# Patient Record
Sex: Female | Born: 1950 | Race: White | Hispanic: No | Marital: Married | Smoking: Never smoker
Health system: Southern US, Community
[De-identification: ages and names within clinical notes are randomized; demographics above are authoritative.]

## PROBLEM LIST (undated history)

## (undated) ENCOUNTER — Emergency Department (HOSPITAL_COMMUNITY)

## (undated) DIAGNOSIS — I10 Essential (primary) hypertension: Secondary | ICD-10-CM

## (undated) DIAGNOSIS — E119 Type 2 diabetes mellitus without complications: Secondary | ICD-10-CM

## (undated) HISTORY — PX: OTHER SURGICAL HISTORY: SHX169

## (undated) HISTORY — PX: FEMUR SURGERY: SHX943

## (undated) HISTORY — PX: CHOLECYSTECTOMY: SHX55

## (undated) HISTORY — PX: PARTIAL HYSTERECTOMY: SHX80

---

## 2001-11-30 ENCOUNTER — Emergency Department (HOSPITAL_COMMUNITY): Admission: EM | Admit: 2001-11-30 | Discharge: 2001-11-30 | Payer: Self-pay | Admitting: Physical Therapy

## 2001-12-03 ENCOUNTER — Ambulatory Visit (HOSPITAL_BASED_OUTPATIENT_CLINIC_OR_DEPARTMENT_OTHER): Admission: RE | Admit: 2001-12-03 | Discharge: 2001-12-03 | Payer: Self-pay | Admitting: Orthopedic Surgery

## 2006-04-25 ENCOUNTER — Emergency Department (HOSPITAL_COMMUNITY): Admission: EM | Admit: 2006-04-25 | Discharge: 2006-04-25 | Payer: Self-pay | Admitting: Emergency Medicine

## 2011-04-14 DIAGNOSIS — F411 Generalized anxiety disorder: Secondary | ICD-10-CM | POA: Diagnosis not present

## 2011-04-14 DIAGNOSIS — E782 Mixed hyperlipidemia: Secondary | ICD-10-CM | POA: Diagnosis not present

## 2011-04-14 DIAGNOSIS — E669 Obesity, unspecified: Secondary | ICD-10-CM | POA: Diagnosis not present

## 2011-04-14 DIAGNOSIS — I10 Essential (primary) hypertension: Secondary | ICD-10-CM | POA: Diagnosis not present

## 2011-04-14 DIAGNOSIS — B3784 Candidal otitis externa: Secondary | ICD-10-CM | POA: Diagnosis not present

## 2011-04-19 DIAGNOSIS — M1710 Unilateral primary osteoarthritis, unspecified knee: Secondary | ICD-10-CM | POA: Diagnosis not present

## 2011-04-19 DIAGNOSIS — M25869 Other specified joint disorders, unspecified knee: Secondary | ICD-10-CM | POA: Diagnosis not present

## 2011-04-19 DIAGNOSIS — IMO0002 Reserved for concepts with insufficient information to code with codable children: Secondary | ICD-10-CM | POA: Diagnosis not present

## 2011-04-19 DIAGNOSIS — M25569 Pain in unspecified knee: Secondary | ICD-10-CM | POA: Diagnosis not present

## 2011-04-19 DIAGNOSIS — M899 Disorder of bone, unspecified: Secondary | ICD-10-CM | POA: Diagnosis not present

## 2011-07-27 DIAGNOSIS — Z1231 Encounter for screening mammogram for malignant neoplasm of breast: Secondary | ICD-10-CM | POA: Diagnosis not present

## 2011-08-01 DIAGNOSIS — S86819A Strain of other muscle(s) and tendon(s) at lower leg level, unspecified leg, initial encounter: Secondary | ICD-10-CM | POA: Diagnosis not present

## 2011-08-16 DIAGNOSIS — M79609 Pain in unspecified limb: Secondary | ICD-10-CM | POA: Diagnosis not present

## 2011-08-16 DIAGNOSIS — M25569 Pain in unspecified knee: Secondary | ICD-10-CM | POA: Diagnosis not present

## 2011-08-16 DIAGNOSIS — E119 Type 2 diabetes mellitus without complications: Secondary | ICD-10-CM | POA: Diagnosis not present

## 2011-08-16 DIAGNOSIS — Z79899 Other long term (current) drug therapy: Secondary | ICD-10-CM | POA: Diagnosis not present

## 2011-08-16 DIAGNOSIS — S8000XA Contusion of unspecified knee, initial encounter: Secondary | ICD-10-CM | POA: Diagnosis not present

## 2011-08-16 DIAGNOSIS — S8990XA Unspecified injury of unspecified lower leg, initial encounter: Secondary | ICD-10-CM | POA: Diagnosis not present

## 2011-08-16 DIAGNOSIS — S8010XA Contusion of unspecified lower leg, initial encounter: Secondary | ICD-10-CM | POA: Diagnosis not present

## 2011-08-16 DIAGNOSIS — I10 Essential (primary) hypertension: Secondary | ICD-10-CM | POA: Diagnosis not present

## 2011-10-31 DIAGNOSIS — H5450 Low vision, one eye, unspecified eye: Secondary | ICD-10-CM | POA: Diagnosis not present

## 2011-10-31 DIAGNOSIS — E782 Mixed hyperlipidemia: Secondary | ICD-10-CM | POA: Diagnosis not present

## 2011-10-31 DIAGNOSIS — I10 Essential (primary) hypertension: Secondary | ICD-10-CM | POA: Diagnosis not present

## 2011-11-14 DIAGNOSIS — E119 Type 2 diabetes mellitus without complications: Secondary | ICD-10-CM | POA: Diagnosis not present

## 2011-11-23 ENCOUNTER — Encounter (INDEPENDENT_AMBULATORY_CARE_PROVIDER_SITE_OTHER): Payer: Medicare Other | Admitting: Ophthalmology

## 2011-11-23 DIAGNOSIS — E1139 Type 2 diabetes mellitus with other diabetic ophthalmic complication: Secondary | ICD-10-CM | POA: Diagnosis not present

## 2011-11-23 DIAGNOSIS — H43819 Vitreous degeneration, unspecified eye: Secondary | ICD-10-CM

## 2011-11-23 DIAGNOSIS — I10 Essential (primary) hypertension: Secondary | ICD-10-CM

## 2011-11-23 DIAGNOSIS — H35039 Hypertensive retinopathy, unspecified eye: Secondary | ICD-10-CM | POA: Diagnosis not present

## 2011-11-23 DIAGNOSIS — E11319 Type 2 diabetes mellitus with unspecified diabetic retinopathy without macular edema: Secondary | ICD-10-CM

## 2011-11-23 DIAGNOSIS — H2510 Age-related nuclear cataract, unspecified eye: Secondary | ICD-10-CM

## 2011-11-27 ENCOUNTER — Ambulatory Visit (INDEPENDENT_AMBULATORY_CARE_PROVIDER_SITE_OTHER): Payer: Medicare Other | Admitting: Ophthalmology

## 2011-11-27 DIAGNOSIS — I10 Essential (primary) hypertension: Secondary | ICD-10-CM

## 2011-11-27 DIAGNOSIS — H3530 Unspecified macular degeneration: Secondary | ICD-10-CM | POA: Diagnosis not present

## 2011-11-27 DIAGNOSIS — E1139 Type 2 diabetes mellitus with other diabetic ophthalmic complication: Secondary | ICD-10-CM | POA: Diagnosis not present

## 2011-11-27 DIAGNOSIS — H35039 Hypertensive retinopathy, unspecified eye: Secondary | ICD-10-CM

## 2011-11-27 DIAGNOSIS — H353290 Exudative age-related macular degeneration, unspecified eye, stage unspecified: Secondary | ICD-10-CM | POA: Diagnosis not present

## 2011-11-27 DIAGNOSIS — E11319 Type 2 diabetes mellitus with unspecified diabetic retinopathy without macular edema: Secondary | ICD-10-CM | POA: Diagnosis not present

## 2011-11-27 DIAGNOSIS — H43819 Vitreous degeneration, unspecified eye: Secondary | ICD-10-CM

## 2011-11-27 DIAGNOSIS — H2510 Age-related nuclear cataract, unspecified eye: Secondary | ICD-10-CM

## 2011-12-06 DIAGNOSIS — Z23 Encounter for immunization: Secondary | ICD-10-CM | POA: Diagnosis not present

## 2011-12-19 ENCOUNTER — Encounter (INDEPENDENT_AMBULATORY_CARE_PROVIDER_SITE_OTHER): Payer: Medicare Other | Admitting: Ophthalmology

## 2011-12-19 DIAGNOSIS — E11319 Type 2 diabetes mellitus with unspecified diabetic retinopathy without macular edema: Secondary | ICD-10-CM

## 2011-12-19 DIAGNOSIS — H3530 Unspecified macular degeneration: Secondary | ICD-10-CM

## 2011-12-19 DIAGNOSIS — H2510 Age-related nuclear cataract, unspecified eye: Secondary | ICD-10-CM

## 2011-12-19 DIAGNOSIS — I10 Essential (primary) hypertension: Secondary | ICD-10-CM

## 2011-12-19 DIAGNOSIS — E1165 Type 2 diabetes mellitus with hyperglycemia: Secondary | ICD-10-CM

## 2011-12-19 DIAGNOSIS — H353290 Exudative age-related macular degeneration, unspecified eye, stage unspecified: Secondary | ICD-10-CM

## 2011-12-19 DIAGNOSIS — H35039 Hypertensive retinopathy, unspecified eye: Secondary | ICD-10-CM

## 2011-12-19 DIAGNOSIS — H43819 Vitreous degeneration, unspecified eye: Secondary | ICD-10-CM

## 2012-01-17 ENCOUNTER — Encounter (INDEPENDENT_AMBULATORY_CARE_PROVIDER_SITE_OTHER): Payer: Medicare Other | Admitting: Ophthalmology

## 2012-01-17 DIAGNOSIS — E11319 Type 2 diabetes mellitus with unspecified diabetic retinopathy without macular edema: Secondary | ICD-10-CM

## 2012-01-17 DIAGNOSIS — H43819 Vitreous degeneration, unspecified eye: Secondary | ICD-10-CM

## 2012-01-17 DIAGNOSIS — E1139 Type 2 diabetes mellitus with other diabetic ophthalmic complication: Secondary | ICD-10-CM

## 2012-01-17 DIAGNOSIS — H2510 Age-related nuclear cataract, unspecified eye: Secondary | ICD-10-CM

## 2012-01-17 DIAGNOSIS — I10 Essential (primary) hypertension: Secondary | ICD-10-CM

## 2012-01-17 DIAGNOSIS — H3530 Unspecified macular degeneration: Secondary | ICD-10-CM

## 2012-01-17 DIAGNOSIS — H353290 Exudative age-related macular degeneration, unspecified eye, stage unspecified: Secondary | ICD-10-CM

## 2012-01-17 DIAGNOSIS — H35039 Hypertensive retinopathy, unspecified eye: Secondary | ICD-10-CM

## 2012-02-05 DIAGNOSIS — I10 Essential (primary) hypertension: Secondary | ICD-10-CM | POA: Diagnosis not present

## 2012-02-19 ENCOUNTER — Encounter (INDEPENDENT_AMBULATORY_CARE_PROVIDER_SITE_OTHER): Payer: Medicare Other | Admitting: Ophthalmology

## 2012-02-19 DIAGNOSIS — H35039 Hypertensive retinopathy, unspecified eye: Secondary | ICD-10-CM

## 2012-02-19 DIAGNOSIS — H43819 Vitreous degeneration, unspecified eye: Secondary | ICD-10-CM

## 2012-02-19 DIAGNOSIS — H2510 Age-related nuclear cataract, unspecified eye: Secondary | ICD-10-CM

## 2012-02-19 DIAGNOSIS — H3530 Unspecified macular degeneration: Secondary | ICD-10-CM

## 2012-02-19 DIAGNOSIS — E1139 Type 2 diabetes mellitus with other diabetic ophthalmic complication: Secondary | ICD-10-CM

## 2012-02-19 DIAGNOSIS — H353290 Exudative age-related macular degeneration, unspecified eye, stage unspecified: Secondary | ICD-10-CM

## 2012-02-19 DIAGNOSIS — I10 Essential (primary) hypertension: Secondary | ICD-10-CM

## 2012-02-19 DIAGNOSIS — E11319 Type 2 diabetes mellitus with unspecified diabetic retinopathy without macular edema: Secondary | ICD-10-CM

## 2012-03-29 ENCOUNTER — Encounter (INDEPENDENT_AMBULATORY_CARE_PROVIDER_SITE_OTHER): Payer: Medicare Other | Admitting: Ophthalmology

## 2012-03-29 DIAGNOSIS — E1165 Type 2 diabetes mellitus with hyperglycemia: Secondary | ICD-10-CM

## 2012-03-29 DIAGNOSIS — H353290 Exudative age-related macular degeneration, unspecified eye, stage unspecified: Secondary | ICD-10-CM

## 2012-03-29 DIAGNOSIS — H43819 Vitreous degeneration, unspecified eye: Secondary | ICD-10-CM

## 2012-03-29 DIAGNOSIS — H35039 Hypertensive retinopathy, unspecified eye: Secondary | ICD-10-CM

## 2012-03-29 DIAGNOSIS — I10 Essential (primary) hypertension: Secondary | ICD-10-CM

## 2012-03-29 DIAGNOSIS — E11319 Type 2 diabetes mellitus with unspecified diabetic retinopathy without macular edema: Secondary | ICD-10-CM

## 2012-03-29 DIAGNOSIS — H3530 Unspecified macular degeneration: Secondary | ICD-10-CM

## 2012-05-10 ENCOUNTER — Encounter (INDEPENDENT_AMBULATORY_CARE_PROVIDER_SITE_OTHER): Payer: Medicare Other | Admitting: Ophthalmology

## 2012-05-15 ENCOUNTER — Encounter (INDEPENDENT_AMBULATORY_CARE_PROVIDER_SITE_OTHER): Payer: Medicare Other | Admitting: Ophthalmology

## 2012-05-15 DIAGNOSIS — H35039 Hypertensive retinopathy, unspecified eye: Secondary | ICD-10-CM

## 2012-07-03 ENCOUNTER — Encounter (INDEPENDENT_AMBULATORY_CARE_PROVIDER_SITE_OTHER): Payer: Medicare Other | Admitting: Ophthalmology

## 2012-07-10 ENCOUNTER — Encounter (INDEPENDENT_AMBULATORY_CARE_PROVIDER_SITE_OTHER): Payer: Medicare Other | Admitting: Ophthalmology

## 2012-07-10 DIAGNOSIS — H2510 Age-related nuclear cataract, unspecified eye: Secondary | ICD-10-CM

## 2012-07-10 DIAGNOSIS — I10 Essential (primary) hypertension: Secondary | ICD-10-CM | POA: Diagnosis not present

## 2012-07-10 DIAGNOSIS — H3530 Unspecified macular degeneration: Secondary | ICD-10-CM

## 2012-07-10 DIAGNOSIS — H35039 Hypertensive retinopathy, unspecified eye: Secondary | ICD-10-CM

## 2012-07-10 DIAGNOSIS — H353290 Exudative age-related macular degeneration, unspecified eye, stage unspecified: Secondary | ICD-10-CM

## 2012-07-10 DIAGNOSIS — H43819 Vitreous degeneration, unspecified eye: Secondary | ICD-10-CM

## 2012-07-31 DIAGNOSIS — Z1231 Encounter for screening mammogram for malignant neoplasm of breast: Secondary | ICD-10-CM | POA: Diagnosis not present

## 2012-08-13 DIAGNOSIS — I10 Essential (primary) hypertension: Secondary | ICD-10-CM | POA: Diagnosis not present

## 2012-08-14 DIAGNOSIS — I10 Essential (primary) hypertension: Secondary | ICD-10-CM | POA: Diagnosis not present

## 2012-09-04 ENCOUNTER — Encounter (INDEPENDENT_AMBULATORY_CARE_PROVIDER_SITE_OTHER): Payer: Medicare Other | Admitting: Ophthalmology

## 2012-09-04 DIAGNOSIS — H43819 Vitreous degeneration, unspecified eye: Secondary | ICD-10-CM

## 2012-09-04 DIAGNOSIS — H35039 Hypertensive retinopathy, unspecified eye: Secondary | ICD-10-CM | POA: Diagnosis not present

## 2012-09-04 DIAGNOSIS — E11319 Type 2 diabetes mellitus with unspecified diabetic retinopathy without macular edema: Secondary | ICD-10-CM

## 2012-09-04 DIAGNOSIS — E1165 Type 2 diabetes mellitus with hyperglycemia: Secondary | ICD-10-CM

## 2012-09-04 DIAGNOSIS — H3530 Unspecified macular degeneration: Secondary | ICD-10-CM | POA: Diagnosis not present

## 2012-09-04 DIAGNOSIS — H353290 Exudative age-related macular degeneration, unspecified eye, stage unspecified: Secondary | ICD-10-CM

## 2012-09-04 DIAGNOSIS — I10 Essential (primary) hypertension: Secondary | ICD-10-CM

## 2012-09-04 DIAGNOSIS — H2510 Age-related nuclear cataract, unspecified eye: Secondary | ICD-10-CM

## 2012-11-11 DIAGNOSIS — I10 Essential (primary) hypertension: Secondary | ICD-10-CM | POA: Diagnosis not present

## 2012-11-13 ENCOUNTER — Encounter (INDEPENDENT_AMBULATORY_CARE_PROVIDER_SITE_OTHER): Payer: Medicare Other | Admitting: Ophthalmology

## 2012-11-13 DIAGNOSIS — I10 Essential (primary) hypertension: Secondary | ICD-10-CM

## 2012-11-13 DIAGNOSIS — H3530 Unspecified macular degeneration: Secondary | ICD-10-CM

## 2012-11-13 DIAGNOSIS — H43819 Vitreous degeneration, unspecified eye: Secondary | ICD-10-CM

## 2012-11-13 DIAGNOSIS — H35039 Hypertensive retinopathy, unspecified eye: Secondary | ICD-10-CM | POA: Diagnosis not present

## 2012-11-13 DIAGNOSIS — H353290 Exudative age-related macular degeneration, unspecified eye, stage unspecified: Secondary | ICD-10-CM | POA: Diagnosis not present

## 2012-11-24 DIAGNOSIS — Z23 Encounter for immunization: Secondary | ICD-10-CM | POA: Diagnosis not present

## 2013-02-05 ENCOUNTER — Encounter (INDEPENDENT_AMBULATORY_CARE_PROVIDER_SITE_OTHER): Payer: Medicare Other | Admitting: Ophthalmology

## 2013-02-05 DIAGNOSIS — I10 Essential (primary) hypertension: Secondary | ICD-10-CM

## 2013-02-05 DIAGNOSIS — H353290 Exudative age-related macular degeneration, unspecified eye, stage unspecified: Secondary | ICD-10-CM

## 2013-02-05 DIAGNOSIS — H43819 Vitreous degeneration, unspecified eye: Secondary | ICD-10-CM

## 2013-02-05 DIAGNOSIS — H35039 Hypertensive retinopathy, unspecified eye: Secondary | ICD-10-CM

## 2013-02-05 DIAGNOSIS — H3530 Unspecified macular degeneration: Secondary | ICD-10-CM

## 2013-02-11 DIAGNOSIS — Z0000 Encounter for general adult medical examination without abnormal findings: Secondary | ICD-10-CM | POA: Diagnosis not present

## 2013-02-11 DIAGNOSIS — I10 Essential (primary) hypertension: Secondary | ICD-10-CM | POA: Diagnosis not present

## 2013-05-12 DIAGNOSIS — Z13820 Encounter for screening for osteoporosis: Secondary | ICD-10-CM | POA: Diagnosis not present

## 2013-05-12 DIAGNOSIS — M810 Age-related osteoporosis without current pathological fracture: Secondary | ICD-10-CM | POA: Diagnosis not present

## 2013-05-16 DIAGNOSIS — IMO0001 Reserved for inherently not codable concepts without codable children: Secondary | ICD-10-CM | POA: Diagnosis not present

## 2013-05-16 DIAGNOSIS — I10 Essential (primary) hypertension: Secondary | ICD-10-CM | POA: Diagnosis not present

## 2013-05-21 ENCOUNTER — Encounter (INDEPENDENT_AMBULATORY_CARE_PROVIDER_SITE_OTHER): Payer: Medicare Other | Admitting: Ophthalmology

## 2013-05-21 DIAGNOSIS — H43819 Vitreous degeneration, unspecified eye: Secondary | ICD-10-CM

## 2013-05-21 DIAGNOSIS — H35039 Hypertensive retinopathy, unspecified eye: Secondary | ICD-10-CM | POA: Diagnosis not present

## 2013-05-21 DIAGNOSIS — H353290 Exudative age-related macular degeneration, unspecified eye, stage unspecified: Secondary | ICD-10-CM

## 2013-05-21 DIAGNOSIS — I10 Essential (primary) hypertension: Secondary | ICD-10-CM

## 2013-05-21 DIAGNOSIS — H3530 Unspecified macular degeneration: Secondary | ICD-10-CM | POA: Diagnosis not present

## 2013-08-07 DIAGNOSIS — Z1231 Encounter for screening mammogram for malignant neoplasm of breast: Secondary | ICD-10-CM | POA: Diagnosis not present

## 2013-08-15 DIAGNOSIS — S1095XA Superficial foreign body of unspecified part of neck, initial encounter: Secondary | ICD-10-CM | POA: Diagnosis not present

## 2013-08-15 DIAGNOSIS — S0005XA Superficial foreign body of scalp, initial encounter: Secondary | ICD-10-CM | POA: Diagnosis not present

## 2013-08-15 DIAGNOSIS — S0085XA Superficial foreign body of other part of head, initial encounter: Secondary | ICD-10-CM | POA: Diagnosis not present

## 2013-08-15 DIAGNOSIS — R1310 Dysphagia, unspecified: Secondary | ICD-10-CM | POA: Diagnosis not present

## 2013-08-15 DIAGNOSIS — I10 Essential (primary) hypertension: Secondary | ICD-10-CM | POA: Diagnosis not present

## 2013-08-15 DIAGNOSIS — IMO0001 Reserved for inherently not codable concepts without codable children: Secondary | ICD-10-CM | POA: Diagnosis not present

## 2013-09-17 ENCOUNTER — Encounter (INDEPENDENT_AMBULATORY_CARE_PROVIDER_SITE_OTHER): Payer: Medicare Other | Admitting: Ophthalmology

## 2013-09-17 DIAGNOSIS — H3530 Unspecified macular degeneration: Secondary | ICD-10-CM

## 2013-09-17 DIAGNOSIS — H35039 Hypertensive retinopathy, unspecified eye: Secondary | ICD-10-CM

## 2013-09-17 DIAGNOSIS — H2510 Age-related nuclear cataract, unspecified eye: Secondary | ICD-10-CM

## 2013-09-17 DIAGNOSIS — H353290 Exudative age-related macular degeneration, unspecified eye, stage unspecified: Secondary | ICD-10-CM

## 2013-09-17 DIAGNOSIS — I10 Essential (primary) hypertension: Secondary | ICD-10-CM

## 2013-09-17 DIAGNOSIS — H43819 Vitreous degeneration, unspecified eye: Secondary | ICD-10-CM

## 2013-10-09 DIAGNOSIS — E119 Type 2 diabetes mellitus without complications: Secondary | ICD-10-CM | POA: Diagnosis not present

## 2013-11-23 DIAGNOSIS — Z23 Encounter for immunization: Secondary | ICD-10-CM | POA: Diagnosis not present

## 2013-11-28 DIAGNOSIS — IMO0001 Reserved for inherently not codable concepts without codable children: Secondary | ICD-10-CM | POA: Diagnosis not present

## 2013-11-28 DIAGNOSIS — I10 Essential (primary) hypertension: Secondary | ICD-10-CM | POA: Diagnosis not present

## 2014-01-14 ENCOUNTER — Encounter (INDEPENDENT_AMBULATORY_CARE_PROVIDER_SITE_OTHER): Payer: Medicare Other | Admitting: Ophthalmology

## 2014-01-14 DIAGNOSIS — H2513 Age-related nuclear cataract, bilateral: Secondary | ICD-10-CM

## 2014-01-14 DIAGNOSIS — I10 Essential (primary) hypertension: Secondary | ICD-10-CM

## 2014-01-14 DIAGNOSIS — H3531 Nonexudative age-related macular degeneration: Secondary | ICD-10-CM

## 2014-01-14 DIAGNOSIS — H43813 Vitreous degeneration, bilateral: Secondary | ICD-10-CM

## 2014-01-14 DIAGNOSIS — H3532 Exudative age-related macular degeneration: Secondary | ICD-10-CM

## 2014-01-14 DIAGNOSIS — H35033 Hypertensive retinopathy, bilateral: Secondary | ICD-10-CM

## 2014-02-25 DIAGNOSIS — E1065 Type 1 diabetes mellitus with hyperglycemia: Secondary | ICD-10-CM | POA: Diagnosis not present

## 2014-02-25 DIAGNOSIS — I10 Essential (primary) hypertension: Secondary | ICD-10-CM | POA: Diagnosis not present

## 2014-05-20 ENCOUNTER — Encounter (INDEPENDENT_AMBULATORY_CARE_PROVIDER_SITE_OTHER): Payer: Medicare Other | Admitting: Ophthalmology

## 2014-05-20 DIAGNOSIS — H3531 Nonexudative age-related macular degeneration: Secondary | ICD-10-CM

## 2014-05-20 DIAGNOSIS — H35033 Hypertensive retinopathy, bilateral: Secondary | ICD-10-CM

## 2014-05-20 DIAGNOSIS — I10 Essential (primary) hypertension: Secondary | ICD-10-CM

## 2014-05-20 DIAGNOSIS — H3532 Exudative age-related macular degeneration: Secondary | ICD-10-CM | POA: Diagnosis not present

## 2014-05-20 DIAGNOSIS — H43813 Vitreous degeneration, bilateral: Secondary | ICD-10-CM | POA: Diagnosis not present

## 2014-05-27 DIAGNOSIS — E1065 Type 1 diabetes mellitus with hyperglycemia: Secondary | ICD-10-CM | POA: Diagnosis not present

## 2014-05-27 DIAGNOSIS — E119 Type 2 diabetes mellitus without complications: Secondary | ICD-10-CM | POA: Diagnosis not present

## 2014-05-27 DIAGNOSIS — Z0000 Encounter for general adult medical examination without abnormal findings: Secondary | ICD-10-CM | POA: Diagnosis not present

## 2014-05-27 DIAGNOSIS — Z1389 Encounter for screening for other disorder: Secondary | ICD-10-CM | POA: Diagnosis not present

## 2014-05-27 DIAGNOSIS — I10 Essential (primary) hypertension: Secondary | ICD-10-CM | POA: Diagnosis not present

## 2014-08-13 DIAGNOSIS — Z1231 Encounter for screening mammogram for malignant neoplasm of breast: Secondary | ICD-10-CM | POA: Diagnosis not present

## 2014-08-27 DIAGNOSIS — I10 Essential (primary) hypertension: Secondary | ICD-10-CM | POA: Diagnosis not present

## 2014-08-27 DIAGNOSIS — E785 Hyperlipidemia, unspecified: Secondary | ICD-10-CM | POA: Diagnosis not present

## 2014-08-27 DIAGNOSIS — G43909 Migraine, unspecified, not intractable, without status migrainosus: Secondary | ICD-10-CM | POA: Diagnosis not present

## 2014-08-27 DIAGNOSIS — Z880 Allergy status to penicillin: Secondary | ICD-10-CM | POA: Diagnosis not present

## 2014-08-27 DIAGNOSIS — R509 Fever, unspecified: Secondary | ICD-10-CM | POA: Diagnosis not present

## 2014-08-27 DIAGNOSIS — E119 Type 2 diabetes mellitus without complications: Secondary | ICD-10-CM | POA: Diagnosis not present

## 2014-08-27 DIAGNOSIS — K529 Noninfective gastroenteritis and colitis, unspecified: Secondary | ICD-10-CM | POA: Diagnosis not present

## 2014-08-27 DIAGNOSIS — Z8249 Family history of ischemic heart disease and other diseases of the circulatory system: Secondary | ICD-10-CM | POA: Diagnosis not present

## 2014-08-27 DIAGNOSIS — Z79899 Other long term (current) drug therapy: Secondary | ICD-10-CM | POA: Diagnosis not present

## 2014-08-27 DIAGNOSIS — R0789 Other chest pain: Secondary | ICD-10-CM | POA: Diagnosis not present

## 2014-08-27 DIAGNOSIS — E876 Hypokalemia: Secondary | ICD-10-CM | POA: Diagnosis not present

## 2014-08-28 DIAGNOSIS — G43909 Migraine, unspecified, not intractable, without status migrainosus: Secondary | ICD-10-CM | POA: Diagnosis not present

## 2014-08-28 DIAGNOSIS — E876 Hypokalemia: Secondary | ICD-10-CM | POA: Diagnosis not present

## 2014-08-28 DIAGNOSIS — I10 Essential (primary) hypertension: Secondary | ICD-10-CM | POA: Diagnosis not present

## 2014-08-28 DIAGNOSIS — K529 Noninfective gastroenteritis and colitis, unspecified: Secondary | ICD-10-CM | POA: Diagnosis not present

## 2014-08-28 DIAGNOSIS — E119 Type 2 diabetes mellitus without complications: Secondary | ICD-10-CM | POA: Diagnosis not present

## 2014-08-28 DIAGNOSIS — E785 Hyperlipidemia, unspecified: Secondary | ICD-10-CM | POA: Diagnosis not present

## 2014-08-29 DIAGNOSIS — K529 Noninfective gastroenteritis and colitis, unspecified: Secondary | ICD-10-CM | POA: Diagnosis not present

## 2014-08-29 DIAGNOSIS — E876 Hypokalemia: Secondary | ICD-10-CM | POA: Diagnosis not present

## 2014-08-29 DIAGNOSIS — G43909 Migraine, unspecified, not intractable, without status migrainosus: Secondary | ICD-10-CM | POA: Diagnosis not present

## 2014-08-29 DIAGNOSIS — I10 Essential (primary) hypertension: Secondary | ICD-10-CM | POA: Diagnosis not present

## 2014-08-29 DIAGNOSIS — E119 Type 2 diabetes mellitus without complications: Secondary | ICD-10-CM | POA: Diagnosis not present

## 2014-08-29 DIAGNOSIS — E785 Hyperlipidemia, unspecified: Secondary | ICD-10-CM | POA: Diagnosis not present

## 2014-09-11 DIAGNOSIS — E119 Type 2 diabetes mellitus without complications: Secondary | ICD-10-CM | POA: Diagnosis not present

## 2014-09-11 DIAGNOSIS — K2900 Acute gastritis without bleeding: Secondary | ICD-10-CM | POA: Diagnosis not present

## 2014-09-30 ENCOUNTER — Encounter (INDEPENDENT_AMBULATORY_CARE_PROVIDER_SITE_OTHER): Payer: Medicare Other | Admitting: Ophthalmology

## 2014-09-30 DIAGNOSIS — H2513 Age-related nuclear cataract, bilateral: Secondary | ICD-10-CM

## 2014-09-30 DIAGNOSIS — I10 Essential (primary) hypertension: Secondary | ICD-10-CM | POA: Diagnosis not present

## 2014-09-30 DIAGNOSIS — H3531 Nonexudative age-related macular degeneration: Secondary | ICD-10-CM | POA: Diagnosis not present

## 2014-09-30 DIAGNOSIS — H3532 Exudative age-related macular degeneration: Secondary | ICD-10-CM

## 2014-09-30 DIAGNOSIS — H43813 Vitreous degeneration, bilateral: Secondary | ICD-10-CM

## 2014-09-30 DIAGNOSIS — H35033 Hypertensive retinopathy, bilateral: Secondary | ICD-10-CM | POA: Diagnosis not present

## 2014-11-06 DIAGNOSIS — X58XXXA Exposure to other specified factors, initial encounter: Secondary | ICD-10-CM | POA: Diagnosis not present

## 2014-11-06 DIAGNOSIS — S39012A Strain of muscle, fascia and tendon of lower back, initial encounter: Secondary | ICD-10-CM | POA: Diagnosis not present

## 2014-11-06 DIAGNOSIS — Z79899 Other long term (current) drug therapy: Secondary | ICD-10-CM | POA: Diagnosis not present

## 2014-11-06 DIAGNOSIS — E119 Type 2 diabetes mellitus without complications: Secondary | ICD-10-CM | POA: Diagnosis not present

## 2014-11-06 DIAGNOSIS — I10 Essential (primary) hypertension: Secondary | ICD-10-CM | POA: Diagnosis not present

## 2014-11-06 DIAGNOSIS — M545 Low back pain: Secondary | ICD-10-CM | POA: Diagnosis not present

## 2014-11-25 DIAGNOSIS — Z23 Encounter for immunization: Secondary | ICD-10-CM | POA: Diagnosis not present

## 2014-12-14 DIAGNOSIS — E119 Type 2 diabetes mellitus without complications: Secondary | ICD-10-CM | POA: Diagnosis not present

## 2014-12-14 DIAGNOSIS — L570 Actinic keratosis: Secondary | ICD-10-CM | POA: Diagnosis not present

## 2014-12-14 DIAGNOSIS — I10 Essential (primary) hypertension: Secondary | ICD-10-CM | POA: Diagnosis not present

## 2015-02-10 ENCOUNTER — Encounter (INDEPENDENT_AMBULATORY_CARE_PROVIDER_SITE_OTHER): Payer: Medicare Other | Admitting: Ophthalmology

## 2015-02-10 DIAGNOSIS — H43813 Vitreous degeneration, bilateral: Secondary | ICD-10-CM | POA: Diagnosis not present

## 2015-02-10 DIAGNOSIS — H353114 Nonexudative age-related macular degeneration, right eye, advanced atrophic with subfoveal involvement: Secondary | ICD-10-CM | POA: Diagnosis not present

## 2015-02-10 DIAGNOSIS — H35033 Hypertensive retinopathy, bilateral: Secondary | ICD-10-CM | POA: Diagnosis not present

## 2015-02-10 DIAGNOSIS — I10 Essential (primary) hypertension: Secondary | ICD-10-CM | POA: Diagnosis not present

## 2015-02-10 DIAGNOSIS — H353221 Exudative age-related macular degeneration, left eye, with active choroidal neovascularization: Secondary | ICD-10-CM | POA: Diagnosis not present

## 2015-03-01 DIAGNOSIS — Z79899 Other long term (current) drug therapy: Secondary | ICD-10-CM | POA: Diagnosis not present

## 2015-03-01 DIAGNOSIS — M5442 Lumbago with sciatica, left side: Secondary | ICD-10-CM | POA: Diagnosis not present

## 2015-03-01 DIAGNOSIS — I10 Essential (primary) hypertension: Secondary | ICD-10-CM | POA: Diagnosis not present

## 2015-03-01 DIAGNOSIS — E119 Type 2 diabetes mellitus without complications: Secondary | ICD-10-CM | POA: Diagnosis not present

## 2015-03-01 DIAGNOSIS — Z880 Allergy status to penicillin: Secondary | ICD-10-CM | POA: Diagnosis not present

## 2015-03-02 DIAGNOSIS — E119 Type 2 diabetes mellitus without complications: Secondary | ICD-10-CM | POA: Diagnosis not present

## 2015-03-02 DIAGNOSIS — I10 Essential (primary) hypertension: Secondary | ICD-10-CM | POA: Diagnosis not present

## 2015-03-02 DIAGNOSIS — M545 Low back pain: Secondary | ICD-10-CM | POA: Diagnosis not present

## 2015-03-08 DIAGNOSIS — Z6833 Body mass index (BMI) 33.0-33.9, adult: Secondary | ICD-10-CM | POA: Diagnosis not present

## 2015-03-08 DIAGNOSIS — B029 Zoster without complications: Secondary | ICD-10-CM | POA: Diagnosis not present

## 2015-03-16 DIAGNOSIS — Z6833 Body mass index (BMI) 33.0-33.9, adult: Secondary | ICD-10-CM | POA: Diagnosis not present

## 2015-03-16 DIAGNOSIS — B029 Zoster without complications: Secondary | ICD-10-CM | POA: Diagnosis not present

## 2015-04-21 DIAGNOSIS — E7800 Pure hypercholesterolemia, unspecified: Secondary | ICD-10-CM | POA: Diagnosis not present

## 2015-04-21 DIAGNOSIS — I10 Essential (primary) hypertension: Secondary | ICD-10-CM | POA: Diagnosis not present

## 2015-04-21 DIAGNOSIS — G8929 Other chronic pain: Secondary | ICD-10-CM | POA: Diagnosis not present

## 2015-04-21 DIAGNOSIS — E119 Type 2 diabetes mellitus without complications: Secondary | ICD-10-CM | POA: Diagnosis not present

## 2015-04-21 DIAGNOSIS — M545 Low back pain: Secondary | ICD-10-CM | POA: Diagnosis not present

## 2015-04-21 DIAGNOSIS — N390 Urinary tract infection, site not specified: Secondary | ICD-10-CM | POA: Diagnosis not present

## 2015-04-21 DIAGNOSIS — Z79899 Other long term (current) drug therapy: Secondary | ICD-10-CM | POA: Diagnosis not present

## 2015-06-14 DIAGNOSIS — Z6833 Body mass index (BMI) 33.0-33.9, adult: Secondary | ICD-10-CM | POA: Diagnosis not present

## 2015-06-14 DIAGNOSIS — I10 Essential (primary) hypertension: Secondary | ICD-10-CM | POA: Diagnosis not present

## 2015-06-14 DIAGNOSIS — Z1389 Encounter for screening for other disorder: Secondary | ICD-10-CM | POA: Diagnosis not present

## 2015-06-14 DIAGNOSIS — E119 Type 2 diabetes mellitus without complications: Secondary | ICD-10-CM | POA: Diagnosis not present

## 2015-06-14 DIAGNOSIS — Z0000 Encounter for general adult medical examination without abnormal findings: Secondary | ICD-10-CM | POA: Diagnosis not present

## 2015-06-23 ENCOUNTER — Encounter (INDEPENDENT_AMBULATORY_CARE_PROVIDER_SITE_OTHER): Payer: Medicare HMO | Admitting: Ophthalmology

## 2015-06-23 DIAGNOSIS — H2513 Age-related nuclear cataract, bilateral: Secondary | ICD-10-CM | POA: Diagnosis not present

## 2015-06-23 DIAGNOSIS — H353231 Exudative age-related macular degeneration, bilateral, with active choroidal neovascularization: Secondary | ICD-10-CM

## 2015-06-23 DIAGNOSIS — H35033 Hypertensive retinopathy, bilateral: Secondary | ICD-10-CM | POA: Diagnosis not present

## 2015-06-23 DIAGNOSIS — I10 Essential (primary) hypertension: Secondary | ICD-10-CM

## 2015-06-23 DIAGNOSIS — H43813 Vitreous degeneration, bilateral: Secondary | ICD-10-CM | POA: Diagnosis not present

## 2015-07-19 DIAGNOSIS — M810 Age-related osteoporosis without current pathological fracture: Secondary | ICD-10-CM | POA: Diagnosis not present

## 2015-07-19 DIAGNOSIS — M85861 Other specified disorders of bone density and structure, right lower leg: Secondary | ICD-10-CM | POA: Diagnosis not present

## 2015-07-19 DIAGNOSIS — E119 Type 2 diabetes mellitus without complications: Secondary | ICD-10-CM | POA: Diagnosis not present

## 2015-07-19 DIAGNOSIS — Z79899 Other long term (current) drug therapy: Secondary | ICD-10-CM | POA: Diagnosis not present

## 2015-07-19 DIAGNOSIS — I10 Essential (primary) hypertension: Secondary | ICD-10-CM | POA: Diagnosis not present

## 2015-07-19 DIAGNOSIS — E7800 Pure hypercholesterolemia, unspecified: Secondary | ICD-10-CM | POA: Diagnosis not present

## 2015-07-19 DIAGNOSIS — G43909 Migraine, unspecified, not intractable, without status migrainosus: Secondary | ICD-10-CM | POA: Diagnosis not present

## 2015-07-19 DIAGNOSIS — Z780 Asymptomatic menopausal state: Secondary | ICD-10-CM | POA: Diagnosis not present

## 2015-07-19 DIAGNOSIS — Z7984 Long term (current) use of oral hypoglycemic drugs: Secondary | ICD-10-CM | POA: Diagnosis not present

## 2015-07-19 DIAGNOSIS — Z8262 Family history of osteoporosis: Secondary | ICD-10-CM | POA: Diagnosis not present

## 2015-08-16 DIAGNOSIS — Z1231 Encounter for screening mammogram for malignant neoplasm of breast: Secondary | ICD-10-CM | POA: Diagnosis not present

## 2015-09-08 DIAGNOSIS — M170 Bilateral primary osteoarthritis of knee: Secondary | ICD-10-CM | POA: Diagnosis not present

## 2015-09-08 DIAGNOSIS — M62838 Other muscle spasm: Secondary | ICD-10-CM | POA: Diagnosis not present

## 2015-09-08 DIAGNOSIS — E669 Obesity, unspecified: Secondary | ICD-10-CM | POA: Diagnosis not present

## 2015-09-08 DIAGNOSIS — H353290 Exudative age-related macular degeneration, unspecified eye, stage unspecified: Secondary | ICD-10-CM | POA: Diagnosis not present

## 2015-09-08 DIAGNOSIS — M47896 Other spondylosis, lumbar region: Secondary | ICD-10-CM | POA: Diagnosis not present

## 2015-09-08 DIAGNOSIS — Z6833 Body mass index (BMI) 33.0-33.9, adult: Secondary | ICD-10-CM | POA: Diagnosis not present

## 2015-09-08 DIAGNOSIS — E1142 Type 2 diabetes mellitus with diabetic polyneuropathy: Secondary | ICD-10-CM | POA: Diagnosis not present

## 2015-09-08 DIAGNOSIS — E785 Hyperlipidemia, unspecified: Secondary | ICD-10-CM | POA: Diagnosis not present

## 2015-09-08 DIAGNOSIS — I10 Essential (primary) hypertension: Secondary | ICD-10-CM | POA: Diagnosis not present

## 2015-09-16 DIAGNOSIS — I10 Essential (primary) hypertension: Secondary | ICD-10-CM | POA: Diagnosis not present

## 2015-09-16 DIAGNOSIS — M545 Low back pain: Secondary | ICD-10-CM | POA: Diagnosis not present

## 2015-09-16 DIAGNOSIS — Z6834 Body mass index (BMI) 34.0-34.9, adult: Secondary | ICD-10-CM | POA: Diagnosis not present

## 2015-09-16 DIAGNOSIS — E119 Type 2 diabetes mellitus without complications: Secondary | ICD-10-CM | POA: Diagnosis not present

## 2015-10-05 DIAGNOSIS — M545 Low back pain: Secondary | ICD-10-CM | POA: Diagnosis not present

## 2015-10-05 DIAGNOSIS — E119 Type 2 diabetes mellitus without complications: Secondary | ICD-10-CM | POA: Diagnosis not present

## 2015-10-05 DIAGNOSIS — I10 Essential (primary) hypertension: Secondary | ICD-10-CM | POA: Diagnosis not present

## 2015-11-03 ENCOUNTER — Encounter (INDEPENDENT_AMBULATORY_CARE_PROVIDER_SITE_OTHER): Payer: Medicare HMO | Admitting: Ophthalmology

## 2015-11-03 DIAGNOSIS — H35033 Hypertensive retinopathy, bilateral: Secondary | ICD-10-CM

## 2015-11-03 DIAGNOSIS — H353221 Exudative age-related macular degeneration, left eye, with active choroidal neovascularization: Secondary | ICD-10-CM

## 2015-11-03 DIAGNOSIS — H2513 Age-related nuclear cataract, bilateral: Secondary | ICD-10-CM | POA: Diagnosis not present

## 2015-11-03 DIAGNOSIS — H43813 Vitreous degeneration, bilateral: Secondary | ICD-10-CM | POA: Diagnosis not present

## 2015-11-03 DIAGNOSIS — I10 Essential (primary) hypertension: Secondary | ICD-10-CM

## 2015-11-03 DIAGNOSIS — H353114 Nonexudative age-related macular degeneration, right eye, advanced atrophic with subfoveal involvement: Secondary | ICD-10-CM

## 2015-11-12 DIAGNOSIS — M545 Low back pain: Secondary | ICD-10-CM | POA: Diagnosis not present

## 2015-11-12 DIAGNOSIS — E119 Type 2 diabetes mellitus without complications: Secondary | ICD-10-CM | POA: Diagnosis not present

## 2015-11-12 DIAGNOSIS — I10 Essential (primary) hypertension: Secondary | ICD-10-CM | POA: Diagnosis not present

## 2015-12-16 DIAGNOSIS — H5203 Hypermetropia, bilateral: Secondary | ICD-10-CM | POA: Diagnosis not present

## 2015-12-17 DIAGNOSIS — I10 Essential (primary) hypertension: Secondary | ICD-10-CM | POA: Diagnosis not present

## 2015-12-17 DIAGNOSIS — M7061 Trochanteric bursitis, right hip: Secondary | ICD-10-CM | POA: Diagnosis not present

## 2015-12-17 DIAGNOSIS — E119 Type 2 diabetes mellitus without complications: Secondary | ICD-10-CM | POA: Diagnosis not present

## 2015-12-17 DIAGNOSIS — M545 Low back pain: Secondary | ICD-10-CM | POA: Diagnosis not present

## 2015-12-17 DIAGNOSIS — Z6835 Body mass index (BMI) 35.0-35.9, adult: Secondary | ICD-10-CM | POA: Diagnosis not present

## 2016-03-14 DIAGNOSIS — Z0100 Encounter for examination of eyes and vision without abnormal findings: Secondary | ICD-10-CM | POA: Diagnosis not present

## 2016-03-17 DIAGNOSIS — I10 Essential (primary) hypertension: Secondary | ICD-10-CM | POA: Diagnosis not present

## 2016-03-17 DIAGNOSIS — E119 Type 2 diabetes mellitus without complications: Secondary | ICD-10-CM | POA: Diagnosis not present

## 2016-03-17 DIAGNOSIS — M545 Low back pain: Secondary | ICD-10-CM | POA: Diagnosis not present

## 2016-03-17 DIAGNOSIS — Z6837 Body mass index (BMI) 37.0-37.9, adult: Secondary | ICD-10-CM | POA: Diagnosis not present

## 2016-03-22 ENCOUNTER — Encounter (INDEPENDENT_AMBULATORY_CARE_PROVIDER_SITE_OTHER): Payer: Medicare HMO | Admitting: Ophthalmology

## 2016-04-10 ENCOUNTER — Encounter (INDEPENDENT_AMBULATORY_CARE_PROVIDER_SITE_OTHER): Payer: Medicare HMO | Admitting: Ophthalmology

## 2016-04-26 ENCOUNTER — Encounter (INDEPENDENT_AMBULATORY_CARE_PROVIDER_SITE_OTHER): Payer: Medicare HMO | Admitting: Ophthalmology

## 2016-04-26 DIAGNOSIS — I10 Essential (primary) hypertension: Secondary | ICD-10-CM

## 2016-04-26 DIAGNOSIS — H353221 Exudative age-related macular degeneration, left eye, with active choroidal neovascularization: Secondary | ICD-10-CM

## 2016-04-26 DIAGNOSIS — H35033 Hypertensive retinopathy, bilateral: Secondary | ICD-10-CM | POA: Diagnosis not present

## 2016-04-26 DIAGNOSIS — H43813 Vitreous degeneration, bilateral: Secondary | ICD-10-CM | POA: Diagnosis not present

## 2016-04-26 DIAGNOSIS — H2513 Age-related nuclear cataract, bilateral: Secondary | ICD-10-CM

## 2016-04-26 DIAGNOSIS — H353112 Nonexudative age-related macular degeneration, right eye, intermediate dry stage: Secondary | ICD-10-CM

## 2016-06-16 DIAGNOSIS — Z1389 Encounter for screening for other disorder: Secondary | ICD-10-CM | POA: Diagnosis not present

## 2016-06-16 DIAGNOSIS — N3941 Urge incontinence: Secondary | ICD-10-CM | POA: Diagnosis not present

## 2016-06-16 DIAGNOSIS — I10 Essential (primary) hypertension: Secondary | ICD-10-CM | POA: Diagnosis not present

## 2016-06-16 DIAGNOSIS — Z0000 Encounter for general adult medical examination without abnormal findings: Secondary | ICD-10-CM | POA: Diagnosis not present

## 2016-06-16 DIAGNOSIS — M545 Low back pain: Secondary | ICD-10-CM | POA: Diagnosis not present

## 2016-06-16 DIAGNOSIS — E119 Type 2 diabetes mellitus without complications: Secondary | ICD-10-CM | POA: Diagnosis not present

## 2016-06-16 DIAGNOSIS — Z6836 Body mass index (BMI) 36.0-36.9, adult: Secondary | ICD-10-CM | POA: Diagnosis not present

## 2016-08-17 DIAGNOSIS — Z1231 Encounter for screening mammogram for malignant neoplasm of breast: Secondary | ICD-10-CM | POA: Diagnosis not present

## 2016-09-07 DIAGNOSIS — Z6834 Body mass index (BMI) 34.0-34.9, adult: Secondary | ICD-10-CM | POA: Diagnosis not present

## 2016-09-07 DIAGNOSIS — I10 Essential (primary) hypertension: Secondary | ICD-10-CM | POA: Diagnosis not present

## 2016-09-07 DIAGNOSIS — N3941 Urge incontinence: Secondary | ICD-10-CM | POA: Diagnosis not present

## 2016-09-07 DIAGNOSIS — M545 Low back pain: Secondary | ICD-10-CM | POA: Diagnosis not present

## 2016-09-07 DIAGNOSIS — E119 Type 2 diabetes mellitus without complications: Secondary | ICD-10-CM | POA: Diagnosis not present

## 2016-10-11 ENCOUNTER — Encounter (INDEPENDENT_AMBULATORY_CARE_PROVIDER_SITE_OTHER): Payer: Medicare HMO | Admitting: Ophthalmology

## 2016-10-11 DIAGNOSIS — H35033 Hypertensive retinopathy, bilateral: Secondary | ICD-10-CM

## 2016-10-11 DIAGNOSIS — H43813 Vitreous degeneration, bilateral: Secondary | ICD-10-CM | POA: Diagnosis not present

## 2016-10-11 DIAGNOSIS — H2513 Age-related nuclear cataract, bilateral: Secondary | ICD-10-CM

## 2016-10-11 DIAGNOSIS — I10 Essential (primary) hypertension: Secondary | ICD-10-CM

## 2016-10-11 DIAGNOSIS — H353112 Nonexudative age-related macular degeneration, right eye, intermediate dry stage: Secondary | ICD-10-CM

## 2016-10-11 DIAGNOSIS — H353221 Exudative age-related macular degeneration, left eye, with active choroidal neovascularization: Secondary | ICD-10-CM | POA: Diagnosis not present

## 2016-11-28 DIAGNOSIS — E785 Hyperlipidemia, unspecified: Secondary | ICD-10-CM | POA: Diagnosis not present

## 2016-11-28 DIAGNOSIS — N390 Urinary tract infection, site not specified: Secondary | ICD-10-CM | POA: Diagnosis not present

## 2016-11-28 DIAGNOSIS — G43909 Migraine, unspecified, not intractable, without status migrainosus: Secondary | ICD-10-CM | POA: Diagnosis not present

## 2016-11-28 DIAGNOSIS — G43819 Other migraine, intractable, without status migrainosus: Secondary | ICD-10-CM | POA: Diagnosis not present

## 2016-11-28 DIAGNOSIS — I10 Essential (primary) hypertension: Secondary | ICD-10-CM | POA: Diagnosis not present

## 2016-11-28 DIAGNOSIS — E119 Type 2 diabetes mellitus without complications: Secondary | ICD-10-CM | POA: Diagnosis not present

## 2016-11-28 DIAGNOSIS — R51 Headache: Secondary | ICD-10-CM | POA: Diagnosis not present

## 2016-11-28 DIAGNOSIS — E7800 Pure hypercholesterolemia, unspecified: Secondary | ICD-10-CM | POA: Diagnosis not present

## 2016-11-28 DIAGNOSIS — Z9181 History of falling: Secondary | ICD-10-CM | POA: Diagnosis not present

## 2016-11-28 DIAGNOSIS — W19XXXA Unspecified fall, initial encounter: Secondary | ICD-10-CM | POA: Diagnosis not present

## 2016-11-28 DIAGNOSIS — N3001 Acute cystitis with hematuria: Secondary | ICD-10-CM | POA: Diagnosis not present

## 2016-11-28 DIAGNOSIS — R55 Syncope and collapse: Secondary | ICD-10-CM | POA: Diagnosis not present

## 2016-11-28 DIAGNOSIS — B961 Klebsiella pneumoniae [K. pneumoniae] as the cause of diseases classified elsewhere: Secondary | ICD-10-CM | POA: Diagnosis not present

## 2016-11-28 DIAGNOSIS — R42 Dizziness and giddiness: Secondary | ICD-10-CM | POA: Diagnosis not present

## 2016-11-29 DIAGNOSIS — N390 Urinary tract infection, site not specified: Secondary | ICD-10-CM | POA: Diagnosis not present

## 2016-11-29 DIAGNOSIS — R55 Syncope and collapse: Secondary | ICD-10-CM | POA: Diagnosis not present

## 2016-11-30 DIAGNOSIS — R55 Syncope and collapse: Secondary | ICD-10-CM | POA: Diagnosis not present

## 2016-11-30 DIAGNOSIS — N390 Urinary tract infection, site not specified: Secondary | ICD-10-CM | POA: Diagnosis not present

## 2016-12-08 DIAGNOSIS — R55 Syncope and collapse: Secondary | ICD-10-CM | POA: Diagnosis not present

## 2016-12-08 DIAGNOSIS — Z6831 Body mass index (BMI) 31.0-31.9, adult: Secondary | ICD-10-CM | POA: Diagnosis not present

## 2016-12-08 DIAGNOSIS — N3000 Acute cystitis without hematuria: Secondary | ICD-10-CM | POA: Diagnosis not present

## 2017-03-12 DIAGNOSIS — I10 Essential (primary) hypertension: Secondary | ICD-10-CM | POA: Diagnosis not present

## 2017-03-12 DIAGNOSIS — E119 Type 2 diabetes mellitus without complications: Secondary | ICD-10-CM | POA: Diagnosis not present

## 2017-03-12 DIAGNOSIS — E08610 Diabetes mellitus due to underlying condition with diabetic neuropathic arthropathy: Secondary | ICD-10-CM | POA: Diagnosis not present

## 2017-03-12 DIAGNOSIS — R55 Syncope and collapse: Secondary | ICD-10-CM | POA: Diagnosis not present

## 2017-03-12 DIAGNOSIS — Z6831 Body mass index (BMI) 31.0-31.9, adult: Secondary | ICD-10-CM | POA: Diagnosis not present

## 2017-03-12 DIAGNOSIS — N3000 Acute cystitis without hematuria: Secondary | ICD-10-CM | POA: Diagnosis not present

## 2017-03-12 DIAGNOSIS — E7849 Other hyperlipidemia: Secondary | ICD-10-CM | POA: Diagnosis not present

## 2017-04-04 ENCOUNTER — Encounter (INDEPENDENT_AMBULATORY_CARE_PROVIDER_SITE_OTHER): Payer: Medicare HMO | Admitting: Ophthalmology

## 2017-04-09 ENCOUNTER — Encounter (INDEPENDENT_AMBULATORY_CARE_PROVIDER_SITE_OTHER): Payer: Medicare HMO | Admitting: Ophthalmology

## 2017-04-09 DIAGNOSIS — H353112 Nonexudative age-related macular degeneration, right eye, intermediate dry stage: Secondary | ICD-10-CM

## 2017-04-09 DIAGNOSIS — H43813 Vitreous degeneration, bilateral: Secondary | ICD-10-CM

## 2017-04-09 DIAGNOSIS — I10 Essential (primary) hypertension: Secondary | ICD-10-CM | POA: Diagnosis not present

## 2017-04-09 DIAGNOSIS — H353221 Exudative age-related macular degeneration, left eye, with active choroidal neovascularization: Secondary | ICD-10-CM

## 2017-04-09 DIAGNOSIS — H35033 Hypertensive retinopathy, bilateral: Secondary | ICD-10-CM

## 2017-06-11 DIAGNOSIS — E1141 Type 2 diabetes mellitus with diabetic mononeuropathy: Secondary | ICD-10-CM | POA: Diagnosis not present

## 2017-06-11 DIAGNOSIS — E7849 Other hyperlipidemia: Secondary | ICD-10-CM | POA: Diagnosis not present

## 2017-06-11 DIAGNOSIS — I10 Essential (primary) hypertension: Secondary | ICD-10-CM | POA: Diagnosis not present

## 2017-06-11 DIAGNOSIS — Z6833 Body mass index (BMI) 33.0-33.9, adult: Secondary | ICD-10-CM | POA: Diagnosis not present

## 2017-07-03 DIAGNOSIS — H524 Presbyopia: Secondary | ICD-10-CM | POA: Diagnosis not present

## 2017-07-03 DIAGNOSIS — Z0100 Encounter for examination of eyes and vision without abnormal findings: Secondary | ICD-10-CM | POA: Diagnosis not present

## 2017-08-27 DIAGNOSIS — Z1231 Encounter for screening mammogram for malignant neoplasm of breast: Secondary | ICD-10-CM | POA: Diagnosis not present

## 2017-09-12 DIAGNOSIS — I10 Essential (primary) hypertension: Secondary | ICD-10-CM | POA: Diagnosis not present

## 2017-09-12 DIAGNOSIS — Z0000 Encounter for general adult medical examination without abnormal findings: Secondary | ICD-10-CM | POA: Diagnosis not present

## 2017-09-12 DIAGNOSIS — Z1389 Encounter for screening for other disorder: Secondary | ICD-10-CM | POA: Diagnosis not present

## 2017-09-12 DIAGNOSIS — E7849 Other hyperlipidemia: Secondary | ICD-10-CM | POA: Diagnosis not present

## 2017-09-12 DIAGNOSIS — Z6833 Body mass index (BMI) 33.0-33.9, adult: Secondary | ICD-10-CM | POA: Diagnosis not present

## 2017-09-12 DIAGNOSIS — E1141 Type 2 diabetes mellitus with diabetic mononeuropathy: Secondary | ICD-10-CM | POA: Diagnosis not present

## 2017-09-24 ENCOUNTER — Encounter (INDEPENDENT_AMBULATORY_CARE_PROVIDER_SITE_OTHER): Payer: Medicare HMO | Admitting: Ophthalmology

## 2017-09-24 DIAGNOSIS — H35033 Hypertensive retinopathy, bilateral: Secondary | ICD-10-CM

## 2017-09-24 DIAGNOSIS — I10 Essential (primary) hypertension: Secondary | ICD-10-CM

## 2017-09-24 DIAGNOSIS — H353112 Nonexudative age-related macular degeneration, right eye, intermediate dry stage: Secondary | ICD-10-CM

## 2017-09-24 DIAGNOSIS — H43813 Vitreous degeneration, bilateral: Secondary | ICD-10-CM

## 2017-09-24 DIAGNOSIS — H2513 Age-related nuclear cataract, bilateral: Secondary | ICD-10-CM | POA: Diagnosis not present

## 2017-09-24 DIAGNOSIS — H353221 Exudative age-related macular degeneration, left eye, with active choroidal neovascularization: Secondary | ICD-10-CM

## 2017-09-25 DIAGNOSIS — E785 Hyperlipidemia, unspecified: Secondary | ICD-10-CM | POA: Diagnosis not present

## 2017-09-25 DIAGNOSIS — G43909 Migraine, unspecified, not intractable, without status migrainosus: Secondary | ICD-10-CM | POA: Diagnosis not present

## 2017-09-25 DIAGNOSIS — G43019 Migraine without aura, intractable, without status migrainosus: Secondary | ICD-10-CM | POA: Diagnosis not present

## 2017-09-25 DIAGNOSIS — M25551 Pain in right hip: Secondary | ICD-10-CM | POA: Diagnosis not present

## 2017-09-25 DIAGNOSIS — M542 Cervicalgia: Secondary | ICD-10-CM | POA: Diagnosis not present

## 2017-09-25 DIAGNOSIS — Z79899 Other long term (current) drug therapy: Secondary | ICD-10-CM | POA: Diagnosis not present

## 2017-09-25 DIAGNOSIS — E119 Type 2 diabetes mellitus without complications: Secondary | ICD-10-CM | POA: Diagnosis not present

## 2017-09-25 DIAGNOSIS — I10 Essential (primary) hypertension: Secondary | ICD-10-CM | POA: Diagnosis not present

## 2017-09-25 DIAGNOSIS — R55 Syncope and collapse: Secondary | ICD-10-CM | POA: Diagnosis not present

## 2017-09-25 DIAGNOSIS — N390 Urinary tract infection, site not specified: Secondary | ICD-10-CM | POA: Diagnosis not present

## 2017-09-25 DIAGNOSIS — M25511 Pain in right shoulder: Secondary | ICD-10-CM | POA: Diagnosis not present

## 2017-09-25 DIAGNOSIS — E7849 Other hyperlipidemia: Secondary | ICD-10-CM | POA: Diagnosis not present

## 2017-09-25 DIAGNOSIS — I700 Atherosclerosis of aorta: Secondary | ICD-10-CM | POA: Diagnosis not present

## 2017-09-26 DIAGNOSIS — M25551 Pain in right hip: Secondary | ICD-10-CM | POA: Diagnosis not present

## 2017-09-26 DIAGNOSIS — E785 Hyperlipidemia, unspecified: Secondary | ICD-10-CM | POA: Diagnosis not present

## 2017-09-26 DIAGNOSIS — G43909 Migraine, unspecified, not intractable, without status migrainosus: Secondary | ICD-10-CM | POA: Diagnosis not present

## 2017-09-26 DIAGNOSIS — Z79899 Other long term (current) drug therapy: Secondary | ICD-10-CM | POA: Diagnosis not present

## 2017-09-26 DIAGNOSIS — M25511 Pain in right shoulder: Secondary | ICD-10-CM | POA: Diagnosis not present

## 2017-09-26 DIAGNOSIS — N390 Urinary tract infection, site not specified: Secondary | ICD-10-CM | POA: Diagnosis not present

## 2017-09-26 DIAGNOSIS — E119 Type 2 diabetes mellitus without complications: Secondary | ICD-10-CM | POA: Diagnosis not present

## 2017-09-26 DIAGNOSIS — G43019 Migraine without aura, intractable, without status migrainosus: Secondary | ICD-10-CM | POA: Diagnosis not present

## 2017-09-26 DIAGNOSIS — E7849 Other hyperlipidemia: Secondary | ICD-10-CM | POA: Diagnosis not present

## 2017-09-26 DIAGNOSIS — R55 Syncope and collapse: Secondary | ICD-10-CM | POA: Diagnosis not present

## 2017-09-26 DIAGNOSIS — S79911A Unspecified injury of right hip, initial encounter: Secondary | ICD-10-CM | POA: Diagnosis not present

## 2017-09-26 DIAGNOSIS — I10 Essential (primary) hypertension: Secondary | ICD-10-CM | POA: Diagnosis not present

## 2017-09-27 DIAGNOSIS — E7849 Other hyperlipidemia: Secondary | ICD-10-CM | POA: Diagnosis not present

## 2017-09-27 DIAGNOSIS — I10 Essential (primary) hypertension: Secondary | ICD-10-CM | POA: Diagnosis not present

## 2017-09-27 DIAGNOSIS — G43019 Migraine without aura, intractable, without status migrainosus: Secondary | ICD-10-CM | POA: Diagnosis not present

## 2017-09-27 DIAGNOSIS — M25511 Pain in right shoulder: Secondary | ICD-10-CM | POA: Diagnosis not present

## 2017-09-27 DIAGNOSIS — N390 Urinary tract infection, site not specified: Secondary | ICD-10-CM | POA: Diagnosis not present

## 2017-09-27 DIAGNOSIS — E785 Hyperlipidemia, unspecified: Secondary | ICD-10-CM | POA: Diagnosis not present

## 2017-09-27 DIAGNOSIS — R55 Syncope and collapse: Secondary | ICD-10-CM | POA: Diagnosis not present

## 2017-09-27 DIAGNOSIS — E119 Type 2 diabetes mellitus without complications: Secondary | ICD-10-CM | POA: Diagnosis not present

## 2017-09-27 DIAGNOSIS — M25551 Pain in right hip: Secondary | ICD-10-CM | POA: Diagnosis not present

## 2017-09-27 DIAGNOSIS — G43909 Migraine, unspecified, not intractable, without status migrainosus: Secondary | ICD-10-CM | POA: Diagnosis not present

## 2017-09-27 DIAGNOSIS — Z79899 Other long term (current) drug therapy: Secondary | ICD-10-CM | POA: Diagnosis not present

## 2017-10-09 DIAGNOSIS — M810 Age-related osteoporosis without current pathological fracture: Secondary | ICD-10-CM | POA: Diagnosis not present

## 2017-10-09 DIAGNOSIS — E2839 Other primary ovarian failure: Secondary | ICD-10-CM | POA: Diagnosis not present

## 2017-10-15 DIAGNOSIS — Z6833 Body mass index (BMI) 33.0-33.9, adult: Secondary | ICD-10-CM | POA: Diagnosis not present

## 2017-10-15 DIAGNOSIS — R55 Syncope and collapse: Secondary | ICD-10-CM | POA: Diagnosis not present

## 2017-12-13 DIAGNOSIS — I10 Essential (primary) hypertension: Secondary | ICD-10-CM | POA: Diagnosis not present

## 2017-12-13 DIAGNOSIS — E119 Type 2 diabetes mellitus without complications: Secondary | ICD-10-CM | POA: Diagnosis not present

## 2017-12-13 DIAGNOSIS — Z6833 Body mass index (BMI) 33.0-33.9, adult: Secondary | ICD-10-CM | POA: Diagnosis not present

## 2017-12-13 DIAGNOSIS — M545 Low back pain: Secondary | ICD-10-CM | POA: Diagnosis not present

## 2017-12-13 DIAGNOSIS — E782 Mixed hyperlipidemia: Secondary | ICD-10-CM | POA: Diagnosis not present

## 2018-01-12 DIAGNOSIS — N390 Urinary tract infection, site not specified: Secondary | ICD-10-CM | POA: Diagnosis not present

## 2018-01-12 DIAGNOSIS — M545 Low back pain: Secondary | ICD-10-CM | POA: Diagnosis not present

## 2018-01-12 DIAGNOSIS — G8929 Other chronic pain: Secondary | ICD-10-CM | POA: Diagnosis not present

## 2018-01-12 DIAGNOSIS — R319 Hematuria, unspecified: Secondary | ICD-10-CM | POA: Diagnosis not present

## 2018-01-12 DIAGNOSIS — Z79899 Other long term (current) drug therapy: Secondary | ICD-10-CM | POA: Diagnosis not present

## 2018-01-12 DIAGNOSIS — E119 Type 2 diabetes mellitus without complications: Secondary | ICD-10-CM | POA: Diagnosis not present

## 2018-01-12 DIAGNOSIS — E7800 Pure hypercholesterolemia, unspecified: Secondary | ICD-10-CM | POA: Diagnosis not present

## 2018-01-12 DIAGNOSIS — Z7984 Long term (current) use of oral hypoglycemic drugs: Secondary | ICD-10-CM | POA: Diagnosis not present

## 2018-01-12 DIAGNOSIS — I10 Essential (primary) hypertension: Secondary | ICD-10-CM | POA: Diagnosis not present

## 2018-01-12 DIAGNOSIS — M549 Dorsalgia, unspecified: Secondary | ICD-10-CM | POA: Diagnosis not present

## 2018-01-16 DIAGNOSIS — Z6833 Body mass index (BMI) 33.0-33.9, adult: Secondary | ICD-10-CM | POA: Diagnosis not present

## 2018-01-16 DIAGNOSIS — N3081 Other cystitis with hematuria: Secondary | ICD-10-CM | POA: Diagnosis not present

## 2018-03-13 ENCOUNTER — Encounter (INDEPENDENT_AMBULATORY_CARE_PROVIDER_SITE_OTHER): Payer: Medicare HMO | Admitting: Ophthalmology

## 2018-03-13 DIAGNOSIS — H43813 Vitreous degeneration, bilateral: Secondary | ICD-10-CM | POA: Diagnosis not present

## 2018-03-13 DIAGNOSIS — I10 Essential (primary) hypertension: Secondary | ICD-10-CM | POA: Diagnosis not present

## 2018-03-13 DIAGNOSIS — H353221 Exudative age-related macular degeneration, left eye, with active choroidal neovascularization: Secondary | ICD-10-CM | POA: Diagnosis not present

## 2018-03-13 DIAGNOSIS — H35033 Hypertensive retinopathy, bilateral: Secondary | ICD-10-CM | POA: Diagnosis not present

## 2018-03-13 DIAGNOSIS — H2513 Age-related nuclear cataract, bilateral: Secondary | ICD-10-CM

## 2018-03-13 DIAGNOSIS — H353112 Nonexudative age-related macular degeneration, right eye, intermediate dry stage: Secondary | ICD-10-CM

## 2018-03-18 DIAGNOSIS — E119 Type 2 diabetes mellitus without complications: Secondary | ICD-10-CM | POA: Diagnosis not present

## 2018-03-18 DIAGNOSIS — R51 Headache: Secondary | ICD-10-CM | POA: Diagnosis not present

## 2018-03-18 DIAGNOSIS — Z6834 Body mass index (BMI) 34.0-34.9, adult: Secondary | ICD-10-CM | POA: Diagnosis not present

## 2018-03-18 DIAGNOSIS — M545 Low back pain: Secondary | ICD-10-CM | POA: Diagnosis not present

## 2018-03-18 DIAGNOSIS — I10 Essential (primary) hypertension: Secondary | ICD-10-CM | POA: Diagnosis not present

## 2018-07-01 DIAGNOSIS — Z6835 Body mass index (BMI) 35.0-35.9, adult: Secondary | ICD-10-CM | POA: Diagnosis not present

## 2018-07-01 DIAGNOSIS — M545 Low back pain: Secondary | ICD-10-CM | POA: Diagnosis not present

## 2018-07-01 DIAGNOSIS — E119 Type 2 diabetes mellitus without complications: Secondary | ICD-10-CM | POA: Diagnosis not present

## 2018-07-01 DIAGNOSIS — I10 Essential (primary) hypertension: Secondary | ICD-10-CM | POA: Diagnosis not present

## 2018-07-01 DIAGNOSIS — Z0000 Encounter for general adult medical examination without abnormal findings: Secondary | ICD-10-CM | POA: Diagnosis not present

## 2018-08-28 ENCOUNTER — Encounter (INDEPENDENT_AMBULATORY_CARE_PROVIDER_SITE_OTHER): Payer: Medicare HMO | Admitting: Ophthalmology

## 2018-08-28 ENCOUNTER — Other Ambulatory Visit: Payer: Self-pay

## 2018-08-28 DIAGNOSIS — H43813 Vitreous degeneration, bilateral: Secondary | ICD-10-CM | POA: Diagnosis not present

## 2018-08-28 DIAGNOSIS — H353221 Exudative age-related macular degeneration, left eye, with active choroidal neovascularization: Secondary | ICD-10-CM

## 2018-08-28 DIAGNOSIS — I10 Essential (primary) hypertension: Secondary | ICD-10-CM

## 2018-08-28 DIAGNOSIS — H353112 Nonexudative age-related macular degeneration, right eye, intermediate dry stage: Secondary | ICD-10-CM

## 2018-08-28 DIAGNOSIS — H35033 Hypertensive retinopathy, bilateral: Secondary | ICD-10-CM

## 2018-10-01 DIAGNOSIS — E119 Type 2 diabetes mellitus without complications: Secondary | ICD-10-CM | POA: Diagnosis not present

## 2018-10-01 DIAGNOSIS — Z0000 Encounter for general adult medical examination without abnormal findings: Secondary | ICD-10-CM | POA: Diagnosis not present

## 2018-10-01 DIAGNOSIS — M545 Low back pain: Secondary | ICD-10-CM | POA: Diagnosis not present

## 2018-10-01 DIAGNOSIS — Z6834 Body mass index (BMI) 34.0-34.9, adult: Secondary | ICD-10-CM | POA: Diagnosis not present

## 2018-10-01 DIAGNOSIS — Z1389 Encounter for screening for other disorder: Secondary | ICD-10-CM | POA: Diagnosis not present

## 2018-10-01 DIAGNOSIS — I10 Essential (primary) hypertension: Secondary | ICD-10-CM | POA: Diagnosis not present

## 2018-10-30 ENCOUNTER — Other Ambulatory Visit: Payer: Self-pay

## 2018-10-30 ENCOUNTER — Encounter (INDEPENDENT_AMBULATORY_CARE_PROVIDER_SITE_OTHER): Payer: Medicare HMO | Admitting: Ophthalmology

## 2018-10-30 DIAGNOSIS — H35033 Hypertensive retinopathy, bilateral: Secondary | ICD-10-CM | POA: Diagnosis not present

## 2018-10-30 DIAGNOSIS — H43813 Vitreous degeneration, bilateral: Secondary | ICD-10-CM | POA: Diagnosis not present

## 2018-10-30 DIAGNOSIS — I10 Essential (primary) hypertension: Secondary | ICD-10-CM

## 2018-10-30 DIAGNOSIS — H353112 Nonexudative age-related macular degeneration, right eye, intermediate dry stage: Secondary | ICD-10-CM | POA: Diagnosis not present

## 2018-10-30 DIAGNOSIS — H353221 Exudative age-related macular degeneration, left eye, with active choroidal neovascularization: Secondary | ICD-10-CM

## 2018-10-30 DIAGNOSIS — H2513 Age-related nuclear cataract, bilateral: Secondary | ICD-10-CM

## 2018-11-14 DIAGNOSIS — Z1231 Encounter for screening mammogram for malignant neoplasm of breast: Secondary | ICD-10-CM | POA: Diagnosis not present

## 2018-12-17 DIAGNOSIS — I10 Essential (primary) hypertension: Secondary | ICD-10-CM | POA: Diagnosis not present

## 2018-12-17 DIAGNOSIS — M545 Low back pain: Secondary | ICD-10-CM | POA: Diagnosis not present

## 2018-12-17 DIAGNOSIS — E119 Type 2 diabetes mellitus without complications: Secondary | ICD-10-CM | POA: Diagnosis not present

## 2018-12-17 DIAGNOSIS — Z6834 Body mass index (BMI) 34.0-34.9, adult: Secondary | ICD-10-CM | POA: Diagnosis not present

## 2018-12-26 DIAGNOSIS — M1711 Unilateral primary osteoarthritis, right knee: Secondary | ICD-10-CM | POA: Diagnosis not present

## 2018-12-26 DIAGNOSIS — M25551 Pain in right hip: Secondary | ICD-10-CM | POA: Diagnosis not present

## 2018-12-26 DIAGNOSIS — M1611 Unilateral primary osteoarthritis, right hip: Secondary | ICD-10-CM | POA: Diagnosis not present

## 2018-12-26 DIAGNOSIS — M25561 Pain in right knee: Secondary | ICD-10-CM | POA: Diagnosis not present

## 2019-01-01 ENCOUNTER — Encounter (INDEPENDENT_AMBULATORY_CARE_PROVIDER_SITE_OTHER): Payer: Medicare HMO | Admitting: Ophthalmology

## 2019-01-01 ENCOUNTER — Other Ambulatory Visit: Payer: Self-pay

## 2019-01-01 DIAGNOSIS — H353112 Nonexudative age-related macular degeneration, right eye, intermediate dry stage: Secondary | ICD-10-CM | POA: Diagnosis not present

## 2019-01-01 DIAGNOSIS — H2513 Age-related nuclear cataract, bilateral: Secondary | ICD-10-CM

## 2019-01-01 DIAGNOSIS — H43813 Vitreous degeneration, bilateral: Secondary | ICD-10-CM

## 2019-01-01 DIAGNOSIS — I10 Essential (primary) hypertension: Secondary | ICD-10-CM | POA: Diagnosis not present

## 2019-01-01 DIAGNOSIS — H353221 Exudative age-related macular degeneration, left eye, with active choroidal neovascularization: Secondary | ICD-10-CM

## 2019-01-01 DIAGNOSIS — H35033 Hypertensive retinopathy, bilateral: Secondary | ICD-10-CM | POA: Diagnosis not present

## 2019-01-16 ENCOUNTER — Ambulatory Visit (INDEPENDENT_AMBULATORY_CARE_PROVIDER_SITE_OTHER): Payer: Medicare HMO

## 2019-01-16 ENCOUNTER — Ambulatory Visit (INDEPENDENT_AMBULATORY_CARE_PROVIDER_SITE_OTHER): Payer: Medicare HMO | Admitting: Orthopaedic Surgery

## 2019-01-16 ENCOUNTER — Encounter: Payer: Self-pay | Admitting: Orthopaedic Surgery

## 2019-01-16 VITALS — BP 112/68 | HR 84 | Ht 67.0 in | Wt 220.0 lb

## 2019-01-16 DIAGNOSIS — M7061 Trochanteric bursitis, right hip: Secondary | ICD-10-CM | POA: Diagnosis not present

## 2019-01-16 DIAGNOSIS — G8929 Other chronic pain: Secondary | ICD-10-CM | POA: Diagnosis not present

## 2019-01-16 DIAGNOSIS — M545 Low back pain: Secondary | ICD-10-CM | POA: Diagnosis not present
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
## 2019-01-20 ENCOUNTER — Encounter: Payer: Self-pay | Admitting: Orthopaedic Surgery

## 2019-01-20 DIAGNOSIS — M7061 Trochanteric bursitis, right hip: Secondary | ICD-10-CM | POA: Diagnosis not present

## 2019-01-20 DIAGNOSIS — G8929 Other chronic pain: Secondary | ICD-10-CM | POA: Diagnosis not present

## 2019-01-20 DIAGNOSIS — M545 Low back pain: Secondary | ICD-10-CM | POA: Diagnosis not present

## 2019-01-20 MED ORDER — METHYLPREDNISOLONE ACETATE 40 MG/ML IJ SUSP
40.0000 mg | INTRAMUSCULAR | Status: AC | PRN
  Administered 2019-01-20: 40 mg via INTRA_ARTICULAR

## 2019-01-20 MED ORDER — BUPIVACAINE HCL 0.5 % IJ SOLN
2.0000 mL | INTRAMUSCULAR | Status: AC | PRN
  Administered 2019-01-20: 2 mL via INTRA_ARTICULAR

## 2019-01-20 MED ORDER — LIDOCAINE HCL 1 % IJ SOLN
0.5000 mL | INTRAMUSCULAR | Status: AC | PRN
  Administered 2019-01-20: .5 mL

## 2019-02-06 ENCOUNTER — Ambulatory Visit (INDEPENDENT_AMBULATORY_CARE_PROVIDER_SITE_OTHER): Payer: Medicare HMO | Admitting: Orthopaedic Surgery

## 2019-02-06 ENCOUNTER — Encounter: Payer: Self-pay | Admitting: Orthopaedic Surgery

## 2019-02-06 ENCOUNTER — Other Ambulatory Visit: Payer: Self-pay

## 2019-02-06 VITALS — BP 119/68 | HR 82 | Ht 67.0 in | Wt 220.0 lb

## 2019-02-06 DIAGNOSIS — M79604 Pain in right leg: Secondary | ICD-10-CM

## 2019-02-06 DIAGNOSIS — M7061 Trochanteric bursitis, right hip: Secondary | ICD-10-CM

## 2019-02-06 DIAGNOSIS — M5441 Lumbago with sciatica, right side: Secondary | ICD-10-CM

## 2019-02-06 NOTE — Progress Notes (Signed)
Office Visit Note   Patient: Priscilla Adams           Date of Birth: [birth year]           MRN: [id number] Visit Date: 02/06/2019              Requested by: No referring provider defined for this encounter. PCP: Patient, No Pcp Per   Assessment & Plan: Visit Diagnoses:  1. Pain in right leg   2. Right-sided low back pain with right-sided sciatica, unspecified chronicity   3. Trochanteric bursitis, right hip     Plan: We will obtain a venous Doppler make sure she does not have DVT for her symptoms which may be radicular related to the lumbar spine.  Previous lumbar x-rays showed some spurring on the right at L2-3 and on the left at L4-5.  Mild facet arthritis.  We will recheck her in a couple weeks the Doppler test is negative we will consider lumbar spine imaging studies.  Follow-Up Instructions: Return in about 1 week (around 02/13/2019).   Orders:  Orders Placed This Encounter  Procedures   US Venous Img Lower Unilateral Right (DVT)   No orders of the defined types were placed in this encounter.     Procedures: No procedures performed   Clinical Data: No additional findings.   Subjective: Chief Complaint  Patient presents with   Right Leg - Follow-up    HPI 68 year old female returns post right trochanteric injection on 01/16/2019.  She states the injection lasted about 5 days and then she had recurrent pain states at times her pain is 10 out of 10.  She has some posterior hip pain posterior thigh pain that radiates laterally into the calf which she describes as pins-and-needles.  No relief with ibuprofen.  She not had a Doppler test.  She denies any lower extremity swelling no history of DVT.  She has tried Tylenol 3 as well as ibuprofen.  Pain causes her to limp.  No bowel bladder symptoms no fever chills.  Review of Systems 14 point review of systems updated and is unchanged from 01/16/2019 office visit.  Previous femur fracture on the left treated by Dr.  Ivin Poot with some prominence at the nail above the greater trochanter.  Patient has good knee range of motion without crepitus.  Anterior tib EHL is strong.   Objective: Vital Signs: BP 119/68    Pulse 82    Ht 5\' 7"  (1.702 m)    Wt 220 lb (99.8 kg)    BMI 34.46 kg/m   Physical Exam Constitutional:      Appearance: She is well-developed.  HENT:     Head: Normocephalic.     Right Ear: External ear normal.     Left Ear: External ear normal.  Eyes:     Pupils: Pupils are equal, round, and reactive to light.  Neck:     Thyroid: No thyromegaly.     Trachea: No tracheal deviation.  Cardiovascular:     Rate and Rhythm: Normal rate.  Pulmonary:     Effort: Pulmonary effort is normal.  Abdominal:     Palpations: Abdomen is soft.  Skin:    General: Skin is warm and dry.  Neurological:     Mental Status: She is alert and oriented to person, place, and time.  Psychiatric:        Behavior: Behavior normal.     Ortho Exam well-healed hip incision on the left from antegrade femoral nail.  She has tenderness over the right greater trochanter but not as significant as last office visit 01/16/2019.  Lower extremity reflexes are 2+.  She has some tenderness over the peroneal nerve at the fibula.  Specialty Comments:  No specialty comments available.  Imaging: US Venous Img Lower Unilateral Right (dvt)  Result Date: 02/07/2019 CLINICAL DATA:  Right lower extremity pain for the past 3 months. Evaluate for DVT. EXAM: RIGHT LOWER EXTREMITY VENOUS DOPPLER ULTRASOUND TECHNIQUE: Gray-scale sonography with graded compression, as well as color Doppler and duplex ultrasound were performed to evaluate the lower extremity deep venous systems from the level of the common femoral vein and including the common femoral, femoral, profunda femoral, popliteal and calf veins including the posterior tibial, peroneal and gastrocnemius veins when visible. The superficial great saphenous vein was also interrogated.  Spectral Doppler was utilized to evaluate flow at rest and with distal augmentation maneuvers in the common femoral, femoral and popliteal veins. COMPARISON:  None. FINDINGS: Contralateral Common Femoral Vein: Respiratory phasicity is normal and symmetric with the symptomatic side. No evidence of thrombus. Normal compressibility. Common Femoral Vein: No evidence of thrombus. Normal compressibility, respiratory phasicity and response to augmentation. Saphenofemoral Junction: No evidence of thrombus. Normal compressibility and flow on color Doppler imaging. Profunda Femoral Vein: No evidence of thrombus. Normal compressibility and flow on color Doppler imaging. Femoral Vein: No evidence of thrombus. Normal compressibility, respiratory phasicity and response to augmentation. Popliteal Vein: No evidence of thrombus. Normal compressibility, respiratory phasicity and response to augmentation. Calf Veins: No evidence of thrombus. Normal compressibility and flow on color Doppler imaging. Superficial Great Saphenous Vein: No evidence of thrombus. Normal compressibility. Venous Reflux:  None. Other Findings:  None. IMPRESSION: No evidence of DVT within the right lower extremity. Electronically Signed   By: Simonne Come M.D.   On: 02/07/2019 10:33     PMFS History: Patient Active Problem List   Diagnosis Date Noted   Low back pain 02/07/2019   Trochanteric bursitis, right hip 01/20/2019   No past medical history on file.  No family history on file.  Past Surgical History:  Procedure Laterality Date   appendectomy     CHOLECYSTECTOMY     FEMUR SURGERY     PARTIAL HYSTERECTOMY     Social History   Occupational History   Not on file  Tobacco Use   Smoking status: Never Smoker   Smokeless tobacco: Never Used  Substance and Sexual Activity   Alcohol use: Not on file   Drug use: Not on file   Sexual activity: Not on file

## 2019-02-07 ENCOUNTER — Ambulatory Visit (HOSPITAL_COMMUNITY)
Admission: RE | Admit: 2019-02-07 | Discharge: 2019-02-07 | Disposition: A | Discharge status: Home / Self Care | Payer: Medicare HMO | Source: Ambulatory Visit | Attending: Orthopaedic Surgery | Admitting: Orthopaedic Surgery

## 2019-02-07 DIAGNOSIS — M79604 Pain in right leg: Secondary | ICD-10-CM | POA: Insufficient documentation

## 2019-02-07 DIAGNOSIS — M545 Low back pain, unspecified: Secondary | ICD-10-CM | POA: Insufficient documentation

## 2019-02-07 DIAGNOSIS — M79661 Pain in right lower leg: Secondary | ICD-10-CM | POA: Diagnosis not present

## 2019-02-13 ENCOUNTER — Encounter: Payer: Self-pay | Admitting: Orthopaedic Surgery

## 2019-02-13 ENCOUNTER — Other Ambulatory Visit: Payer: Self-pay

## 2019-02-13 ENCOUNTER — Ambulatory Visit (INDEPENDENT_AMBULATORY_CARE_PROVIDER_SITE_OTHER): Payer: Medicare HMO | Admitting: Orthopaedic Surgery

## 2019-02-13 VITALS — BP 113/72 | HR 85 | Ht 67.0 in | Wt 220.0 lb

## 2019-02-13 DIAGNOSIS — M79604 Pain in right leg: Secondary | ICD-10-CM | POA: Diagnosis not present

## 2019-02-13 DIAGNOSIS — M5441 Lumbago with sciatica, right side: Secondary | ICD-10-CM

## 2019-02-13 NOTE — Progress Notes (Signed)
Office Visit Note   Patient: Priscilla Adams           Date of Birth: 07/11/1950           MRN: [id number] Visit Date: 02/13/2019              Requested by: No referring provider defined for this encounter. PCP: Patient, No Pcp Per   Assessment & Plan: Visit Diagnoses:  1. Pain in right leg   2. Right-sided low back pain with right-sided sciatica, unspecified chronicity     Plan: Patient said radicular symptoms persistent not responding to anti-inflammatories rest activity modification, Tylenol 3.  Doppler test negative for DVT.  She has some degenerative facet changes in the lumbar spine and we will proceed with MRI scan lumbar spine to rule out lateral recess or foraminal stenosis. Follow-Up Instructions: Patient will return after MRI scan.  Orders:  Orders Placed This Encounter  Procedures  . MR Lumbar Spine w/o contrast   No orders of the defined types were placed in this encounter.     Procedures: No procedures performed   Clinical Data: No additional findings.   Subjective: Chief Complaint  Patient presents with  . Right Leg - Pain    HPI 68 year old female returns with persistent pain in her right leg.  Venous Doppler was obtained to rule out DVT which was negative.  Patient has some lumbar spurring on the right at L2-3 on the left at L4-5 in the lumbar spine.  She denies chills or fever no bowel bladder symptoms.  She states the pain bothers her a lot makes it difficult for her to sleep.  Some pain is laterally in the buttocks radiates down her leg to her calf.  Sometimes she feels like she has pins-and-needles.  She is used ibuprofen.  She is try Tylenol 3 as well without relief.  When the pain is more severe she has to walk with a limp.  Past history of a left femur fracture treated by Dr.Krege.  Review of Systems 14 point system update unchanged from 02/06/2019 office visit.  Positive for diabetes on oral medication.  Post for migraines.   Objective: Vital  Signs: BP 113/72   Pulse 85   Ht 5\' 7"  (1.702 m)   Wt 220 lb (99.8 kg)   BMI 34.46 kg/m   Physical Exam Constitutional:      Appearance: She is well-developed.  HENT:     Head: Normocephalic.     Right Ear: External ear normal.     Left Ear: External ear normal.  Eyes:     Pupils: Pupils are equal, round, and reactive to light.  Neck:     Thyroid: No thyromegaly.     Trachea: No tracheal deviation.  Cardiovascular:     Rate and Rhythm: Normal rate.  Pulmonary:     Effort: Pulmonary effort is normal.  Abdominal:     Palpations: Abdomen is soft.  Skin:    General: Skin is warm and dry.  Neurological:     Mental Status: She is alert and oriented to person, place, and time.  Psychiatric:        Behavior: Behavior normal.     Ortho Exam patient has some sciatic notch tenderness.  This is worse on the right than left negative straight leg raising 90 degrees.  Anterior tib EHL is strong.  Some tenderness over the right greater trochanter.  Good knee range of motion.  Specialty Comments:  No specialty comments  available.  Imaging: No results found.   PMFS History: Patient Active Problem List   Diagnosis Date Noted  . Low back pain 02/07/2019  . Trochanteric bursitis, right hip 01/20/2019   No past medical history on file.  No family history on file.  Past Surgical History:  Procedure Laterality Date  . appendectomy    . CHOLECYSTECTOMY    . FEMUR SURGERY    . PARTIAL HYSTERECTOMY     Social History   Occupational History  . Not on file  Tobacco Use  . Smoking status: Never Smoker  . Smokeless tobacco: Never Used  Substance and Sexual Activity  . Alcohol use: Not on file  . Drug use: Not on file  . Sexual activity: Not on file

## 2019-02-19 ENCOUNTER — Encounter (INDEPENDENT_AMBULATORY_CARE_PROVIDER_SITE_OTHER): Payer: Medicare HMO | Admitting: Ophthalmology

## 2019-02-21 ENCOUNTER — Encounter (INDEPENDENT_AMBULATORY_CARE_PROVIDER_SITE_OTHER): Payer: Medicare HMO | Admitting: Ophthalmology

## 2019-03-11 ENCOUNTER — Encounter (INDEPENDENT_AMBULATORY_CARE_PROVIDER_SITE_OTHER): Payer: Medicare HMO | Admitting: Ophthalmology

## 2019-03-11 DIAGNOSIS — H353221 Exudative age-related macular degeneration, left eye, with active choroidal neovascularization: Secondary | ICD-10-CM | POA: Diagnosis not present

## 2019-03-11 DIAGNOSIS — H43813 Vitreous degeneration, bilateral: Secondary | ICD-10-CM

## 2019-03-11 DIAGNOSIS — H353112 Nonexudative age-related macular degeneration, right eye, intermediate dry stage: Secondary | ICD-10-CM | POA: Diagnosis not present

## 2019-03-11 DIAGNOSIS — H35033 Hypertensive retinopathy, bilateral: Secondary | ICD-10-CM

## 2019-03-11 DIAGNOSIS — I10 Essential (primary) hypertension: Secondary | ICD-10-CM

## 2019-03-13 ENCOUNTER — Other Ambulatory Visit: Payer: Medicare HMO

## 2019-03-19 DIAGNOSIS — M545 Low back pain: Secondary | ICD-10-CM | POA: Diagnosis not present

## 2019-03-19 DIAGNOSIS — Z6835 Body mass index (BMI) 35.0-35.9, adult: Secondary | ICD-10-CM | POA: Diagnosis not present

## 2019-03-19 DIAGNOSIS — I10 Essential (primary) hypertension: Secondary | ICD-10-CM | POA: Diagnosis not present

## 2019-03-19 DIAGNOSIS — E119 Type 2 diabetes mellitus without complications: Secondary | ICD-10-CM | POA: Diagnosis not present

## 2019-03-27 ENCOUNTER — Other Ambulatory Visit: Payer: Medicare HMO

## 2019-04-17 ENCOUNTER — Other Ambulatory Visit: Payer: Medicare HMO

## 2019-04-30 ENCOUNTER — Other Ambulatory Visit: Payer: Medicare HMO

## 2019-05-13 ENCOUNTER — Encounter (INDEPENDENT_AMBULATORY_CARE_PROVIDER_SITE_OTHER): Payer: Medicare HMO | Admitting: Ophthalmology

## 2019-05-13 DIAGNOSIS — I10 Essential (primary) hypertension: Secondary | ICD-10-CM

## 2019-05-13 DIAGNOSIS — H353112 Nonexudative age-related macular degeneration, right eye, intermediate dry stage: Secondary | ICD-10-CM | POA: Diagnosis not present

## 2019-05-13 DIAGNOSIS — H35033 Hypertensive retinopathy, bilateral: Secondary | ICD-10-CM

## 2019-05-13 DIAGNOSIS — H353221 Exudative age-related macular degeneration, left eye, with active choroidal neovascularization: Secondary | ICD-10-CM | POA: Diagnosis not present

## 2019-05-13 DIAGNOSIS — H43813 Vitreous degeneration, bilateral: Secondary | ICD-10-CM

## 2019-05-23 ENCOUNTER — Other Ambulatory Visit: Payer: Self-pay

## 2019-05-23 ENCOUNTER — Ambulatory Visit
Admission: RE | Admit: 2019-05-23 | Discharge: 2019-05-23 | Disposition: A | Discharge status: Home / Self Care | Payer: Medicare HMO | Source: Ambulatory Visit | Attending: Orthopaedic Surgery | Admitting: Orthopaedic Surgery

## 2019-05-23 DIAGNOSIS — M5441 Lumbago with sciatica, right side: Secondary | ICD-10-CM

## 2019-05-23 DIAGNOSIS — M79604 Pain in right leg: Secondary | ICD-10-CM

## 2019-05-29 DIAGNOSIS — M5431 Sciatica, right side: Secondary | ICD-10-CM | POA: Diagnosis not present

## 2019-05-29 DIAGNOSIS — Z6836 Body mass index (BMI) 36.0-36.9, adult: Secondary | ICD-10-CM | POA: Diagnosis not present

## 2019-06-12 ENCOUNTER — Other Ambulatory Visit: Payer: Self-pay

## 2019-06-12 ENCOUNTER — Ambulatory Visit (INDEPENDENT_AMBULATORY_CARE_PROVIDER_SITE_OTHER): Payer: Medicare HMO | Admitting: Orthopaedic Surgery

## 2019-06-12 ENCOUNTER — Encounter: Payer: Self-pay | Admitting: Orthopaedic Surgery

## 2019-06-12 VITALS — BP 119/68 | HR 84 | Ht 67.0 in | Wt 220.0 lb

## 2019-06-12 DIAGNOSIS — M7061 Trochanteric bursitis, right hip: Secondary | ICD-10-CM | POA: Diagnosis not present

## 2019-06-12 MED ORDER — BUPIVACAINE HCL 0.5 % IJ SOLN
2.0000 mL | INTRAMUSCULAR | Status: AC | PRN
  Administered 2019-06-12: 2 mL via INTRA_ARTICULAR

## 2019-06-12 MED ORDER — METHYLPREDNISOLONE ACETATE 40 MG/ML IJ SUSP
40.0000 mg | INTRAMUSCULAR | Status: AC | PRN
  Administered 2019-06-12: 10:00:00 40 mg via INTRA_ARTICULAR

## 2019-06-12 MED ORDER — LIDOCAINE HCL 1 % IJ SOLN
1.0000 mL | INTRAMUSCULAR | Status: AC | PRN
  Administered 2019-06-12: 1 mL
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
## 2019-06-26 DIAGNOSIS — G43919 Migraine, unspecified, intractable, without status migrainosus: Secondary | ICD-10-CM | POA: Diagnosis not present

## 2019-06-26 DIAGNOSIS — Z6835 Body mass index (BMI) 35.0-35.9, adult: Secondary | ICD-10-CM | POA: Diagnosis not present

## 2019-07-22 ENCOUNTER — Other Ambulatory Visit: Payer: Self-pay

## 2019-07-22 ENCOUNTER — Encounter (INDEPENDENT_AMBULATORY_CARE_PROVIDER_SITE_OTHER): Payer: Medicare HMO | Admitting: Ophthalmology

## 2019-07-22 DIAGNOSIS — I10 Essential (primary) hypertension: Secondary | ICD-10-CM

## 2019-07-22 DIAGNOSIS — H43813 Vitreous degeneration, bilateral: Secondary | ICD-10-CM

## 2019-07-22 DIAGNOSIS — H353112 Nonexudative age-related macular degeneration, right eye, intermediate dry stage: Secondary | ICD-10-CM | POA: Diagnosis not present

## 2019-07-22 DIAGNOSIS — H353221 Exudative age-related macular degeneration, left eye, with active choroidal neovascularization: Secondary | ICD-10-CM

## 2019-07-22 DIAGNOSIS — H35033 Hypertensive retinopathy, bilateral: Secondary | ICD-10-CM

## 2019-08-21 DIAGNOSIS — G43919 Migraine, unspecified, intractable, without status migrainosus: Secondary | ICD-10-CM | POA: Diagnosis not present

## 2019-08-21 DIAGNOSIS — Z0000 Encounter for general adult medical examination without abnormal findings: Secondary | ICD-10-CM | POA: Diagnosis not present

## 2019-08-21 DIAGNOSIS — Z6837 Body mass index (BMI) 37.0-37.9, adult: Secondary | ICD-10-CM | POA: Diagnosis not present

## 2019-08-21 DIAGNOSIS — M5431 Sciatica, right side: Secondary | ICD-10-CM | POA: Diagnosis not present

## 2019-08-21 DIAGNOSIS — E119 Type 2 diabetes mellitus without complications: Secondary | ICD-10-CM | POA: Diagnosis not present

## 2019-08-21 DIAGNOSIS — I10 Essential (primary) hypertension: Secondary | ICD-10-CM | POA: Diagnosis not present

## 2019-09-13 DIAGNOSIS — R112 Nausea with vomiting, unspecified: Secondary | ICD-10-CM | POA: Diagnosis not present

## 2019-09-13 DIAGNOSIS — L989 Disorder of the skin and subcutaneous tissue, unspecified: Secondary | ICD-10-CM | POA: Diagnosis not present

## 2019-09-13 DIAGNOSIS — R05 Cough: Secondary | ICD-10-CM | POA: Diagnosis not present

## 2019-09-13 DIAGNOSIS — R1084 Generalized abdominal pain: Secondary | ICD-10-CM | POA: Diagnosis not present

## 2019-09-13 DIAGNOSIS — E119 Type 2 diabetes mellitus without complications: Secondary | ICD-10-CM | POA: Diagnosis not present

## 2019-09-13 DIAGNOSIS — R531 Weakness: Secondary | ICD-10-CM | POA: Diagnosis not present

## 2019-09-13 DIAGNOSIS — N390 Urinary tract infection, site not specified: Secondary | ICD-10-CM | POA: Diagnosis not present

## 2019-09-13 DIAGNOSIS — K449 Diaphragmatic hernia without obstruction or gangrene: Secondary | ICD-10-CM | POA: Diagnosis not present

## 2019-09-13 DIAGNOSIS — I700 Atherosclerosis of aorta: Secondary | ICD-10-CM | POA: Diagnosis not present

## 2019-09-13 DIAGNOSIS — J122 Parainfluenza virus pneumonia: Secondary | ICD-10-CM | POA: Diagnosis not present

## 2019-09-13 DIAGNOSIS — K5730 Diverticulosis of large intestine without perforation or abscess without bleeding: Secondary | ICD-10-CM | POA: Diagnosis not present

## 2019-09-13 DIAGNOSIS — B9789 Other viral agents as the cause of diseases classified elsewhere: Secondary | ICD-10-CM | POA: Diagnosis not present

## 2019-09-13 DIAGNOSIS — R0981 Nasal congestion: Secondary | ICD-10-CM | POA: Diagnosis not present

## 2019-09-13 DIAGNOSIS — R197 Diarrhea, unspecified: Secondary | ICD-10-CM | POA: Diagnosis not present

## 2019-09-30 ENCOUNTER — Encounter (INDEPENDENT_AMBULATORY_CARE_PROVIDER_SITE_OTHER): Payer: Medicare HMO | Admitting: Ophthalmology

## 2019-09-30 ENCOUNTER — Other Ambulatory Visit: Payer: Self-pay

## 2019-09-30 DIAGNOSIS — H353112 Nonexudative age-related macular degeneration, right eye, intermediate dry stage: Secondary | ICD-10-CM | POA: Diagnosis not present

## 2019-09-30 DIAGNOSIS — I10 Essential (primary) hypertension: Secondary | ICD-10-CM | POA: Diagnosis not present

## 2019-09-30 DIAGNOSIS — H353221 Exudative age-related macular degeneration, left eye, with active choroidal neovascularization: Secondary | ICD-10-CM

## 2019-09-30 DIAGNOSIS — H43813 Vitreous degeneration, bilateral: Secondary | ICD-10-CM | POA: Diagnosis not present

## 2019-09-30 DIAGNOSIS — H35033 Hypertensive retinopathy, bilateral: Secondary | ICD-10-CM | POA: Diagnosis not present

## 2019-11-03 DIAGNOSIS — H02055 Trichiasis without entropian left lower eyelid: Secondary | ICD-10-CM | POA: Diagnosis not present

## 2019-11-03 DIAGNOSIS — H353221 Exudative age-related macular degeneration, left eye, with active choroidal neovascularization: Secondary | ICD-10-CM | POA: Diagnosis not present

## 2019-11-03 DIAGNOSIS — H2512 Age-related nuclear cataract, left eye: Secondary | ICD-10-CM | POA: Diagnosis not present

## 2019-11-03 DIAGNOSIS — H2513 Age-related nuclear cataract, bilateral: Secondary | ICD-10-CM | POA: Diagnosis not present

## 2019-11-03 DIAGNOSIS — E113291 Type 2 diabetes mellitus with mild nonproliferative diabetic retinopathy without macular edema, right eye: Secondary | ICD-10-CM | POA: Diagnosis not present

## 2019-11-03 DIAGNOSIS — H25013 Cortical age-related cataract, bilateral: Secondary | ICD-10-CM | POA: Diagnosis not present

## 2019-11-13 DIAGNOSIS — Z1331 Encounter for screening for depression: Secondary | ICD-10-CM | POA: Diagnosis not present

## 2019-11-13 DIAGNOSIS — Z0000 Encounter for general adult medical examination without abnormal findings: Secondary | ICD-10-CM | POA: Diagnosis not present

## 2019-11-13 DIAGNOSIS — I10 Essential (primary) hypertension: Secondary | ICD-10-CM | POA: Diagnosis not present

## 2019-11-13 DIAGNOSIS — E119 Type 2 diabetes mellitus without complications: Secondary | ICD-10-CM | POA: Diagnosis not present

## 2019-11-13 DIAGNOSIS — M5431 Sciatica, right side: Secondary | ICD-10-CM | POA: Diagnosis not present

## 2019-11-13 DIAGNOSIS — Z6836 Body mass index (BMI) 36.0-36.9, adult: Secondary | ICD-10-CM | POA: Diagnosis not present

## 2019-11-18 DIAGNOSIS — H25812 Combined forms of age-related cataract, left eye: Secondary | ICD-10-CM | POA: Diagnosis not present

## 2019-11-18 DIAGNOSIS — H2512 Age-related nuclear cataract, left eye: Secondary | ICD-10-CM | POA: Diagnosis not present

## 2019-12-09 ENCOUNTER — Encounter (INDEPENDENT_AMBULATORY_CARE_PROVIDER_SITE_OTHER): Payer: Medicare HMO | Admitting: Ophthalmology

## 2019-12-10 DIAGNOSIS — H2511 Age-related nuclear cataract, right eye: Secondary | ICD-10-CM | POA: Diagnosis not present

## 2019-12-10 DIAGNOSIS — H25011 Cortical age-related cataract, right eye: Secondary | ICD-10-CM | POA: Diagnosis not present

## 2019-12-16 DIAGNOSIS — H25011 Cortical age-related cataract, right eye: Secondary | ICD-10-CM | POA: Diagnosis not present

## 2019-12-16 DIAGNOSIS — H25811 Combined forms of age-related cataract, right eye: Secondary | ICD-10-CM | POA: Diagnosis not present

## 2019-12-16 DIAGNOSIS — H2511 Age-related nuclear cataract, right eye: Secondary | ICD-10-CM | POA: Diagnosis not present

## 2019-12-24 ENCOUNTER — Other Ambulatory Visit (HOSPITAL_COMMUNITY): Payer: Self-pay | Admitting: Internal Medicine

## 2019-12-24 DIAGNOSIS — Z1231 Encounter for screening mammogram for malignant neoplasm of breast: Secondary | ICD-10-CM

## 2019-12-24 DIAGNOSIS — M810 Age-related osteoporosis without current pathological fracture: Secondary | ICD-10-CM

## 2020-01-01 ENCOUNTER — Encounter (INDEPENDENT_AMBULATORY_CARE_PROVIDER_SITE_OTHER): Payer: Medicare HMO | Admitting: Ophthalmology

## 2020-01-01 ENCOUNTER — Other Ambulatory Visit: Payer: Self-pay

## 2020-01-01 DIAGNOSIS — H35033 Hypertensive retinopathy, bilateral: Secondary | ICD-10-CM | POA: Diagnosis not present

## 2020-01-01 DIAGNOSIS — H353221 Exudative age-related macular degeneration, left eye, with active choroidal neovascularization: Secondary | ICD-10-CM

## 2020-01-01 DIAGNOSIS — H43813 Vitreous degeneration, bilateral: Secondary | ICD-10-CM

## 2020-01-01 DIAGNOSIS — I10 Essential (primary) hypertension: Secondary | ICD-10-CM

## 2020-01-01 DIAGNOSIS — H353114 Nonexudative age-related macular degeneration, right eye, advanced atrophic with subfoveal involvement: Secondary | ICD-10-CM

## 2020-01-13 DIAGNOSIS — Z0100 Encounter for examination of eyes and vision without abnormal findings: Secondary | ICD-10-CM | POA: Diagnosis not present

## 2020-01-14 ENCOUNTER — Ambulatory Visit (HOSPITAL_COMMUNITY)
Admission: RE | Admit: 2020-01-14 | Discharge: 2020-01-14 | Disposition: A | Discharge status: Home / Self Care | Payer: Medicare HMO | Source: Ambulatory Visit | Attending: Internal Medicine | Admitting: Internal Medicine

## 2020-01-14 ENCOUNTER — Other Ambulatory Visit: Payer: Self-pay

## 2020-01-14 DIAGNOSIS — Z1231 Encounter for screening mammogram for malignant neoplasm of breast: Secondary | ICD-10-CM | POA: Diagnosis not present

## 2020-01-14 DIAGNOSIS — M810 Age-related osteoporosis without current pathological fracture: Secondary | ICD-10-CM

## 2020-01-14 DIAGNOSIS — M8589 Other specified disorders of bone density and structure, multiple sites: Secondary | ICD-10-CM | POA: Diagnosis not present

## 2020-01-19 ENCOUNTER — Other Ambulatory Visit (HOSPITAL_COMMUNITY): Payer: Self-pay | Admitting: Internal Medicine

## 2020-01-19 DIAGNOSIS — R928 Other abnormal and inconclusive findings on diagnostic imaging of breast: Secondary | ICD-10-CM

## 2020-02-06 ENCOUNTER — Ambulatory Visit (HOSPITAL_COMMUNITY)
Admission: RE | Admit: 2020-02-06 | Discharge: 2020-02-06 | Disposition: A | Discharge status: Home / Self Care | Payer: Medicare HMO | Source: Ambulatory Visit | Attending: Internal Medicine | Admitting: Internal Medicine

## 2020-02-06 ENCOUNTER — Other Ambulatory Visit: Payer: Self-pay

## 2020-02-06 DIAGNOSIS — R928 Other abnormal and inconclusive findings on diagnostic imaging of breast: Secondary | ICD-10-CM

## 2020-02-06 DIAGNOSIS — R921 Mammographic calcification found on diagnostic imaging of breast: Secondary | ICD-10-CM | POA: Diagnosis not present

## 2020-02-12 DIAGNOSIS — E119 Type 2 diabetes mellitus without complications: Secondary | ICD-10-CM | POA: Diagnosis not present

## 2020-02-12 DIAGNOSIS — I10 Essential (primary) hypertension: Secondary | ICD-10-CM | POA: Diagnosis not present

## 2020-02-12 DIAGNOSIS — M5431 Sciatica, right side: Secondary | ICD-10-CM | POA: Diagnosis not present

## 2020-02-12 DIAGNOSIS — Z6836 Body mass index (BMI) 36.0-36.9, adult: Secondary | ICD-10-CM | POA: Diagnosis not present

## 2020-02-20 ENCOUNTER — Other Ambulatory Visit: Payer: Self-pay | Admitting: Internal Medicine

## 2020-02-20 DIAGNOSIS — R921 Mammographic calcification found on diagnostic imaging of breast: Secondary | ICD-10-CM

## 2020-03-18 ENCOUNTER — Encounter (INDEPENDENT_AMBULATORY_CARE_PROVIDER_SITE_OTHER): Payer: Medicare HMO | Admitting: Ophthalmology

## 2020-03-30 ENCOUNTER — Encounter (INDEPENDENT_AMBULATORY_CARE_PROVIDER_SITE_OTHER): Payer: Medicare HMO | Admitting: Ophthalmology

## 2020-03-30 ENCOUNTER — Other Ambulatory Visit: Payer: Self-pay

## 2020-03-30 DIAGNOSIS — H35033 Hypertensive retinopathy, bilateral: Secondary | ICD-10-CM

## 2020-03-30 DIAGNOSIS — H43813 Vitreous degeneration, bilateral: Secondary | ICD-10-CM | POA: Diagnosis not present

## 2020-03-30 DIAGNOSIS — I10 Essential (primary) hypertension: Secondary | ICD-10-CM

## 2020-03-30 DIAGNOSIS — H353221 Exudative age-related macular degeneration, left eye, with active choroidal neovascularization: Secondary | ICD-10-CM

## 2020-03-30 DIAGNOSIS — H353114 Nonexudative age-related macular degeneration, right eye, advanced atrophic with subfoveal involvement: Secondary | ICD-10-CM | POA: Diagnosis not present

## 2020-05-12 DIAGNOSIS — E1169 Type 2 diabetes mellitus with other specified complication: Secondary | ICD-10-CM | POA: Diagnosis not present

## 2020-05-12 DIAGNOSIS — E7849 Other hyperlipidemia: Secondary | ICD-10-CM | POA: Diagnosis not present

## 2020-05-12 DIAGNOSIS — Z6836 Body mass index (BMI) 36.0-36.9, adult: Secondary | ICD-10-CM | POA: Diagnosis not present

## 2020-05-12 DIAGNOSIS — M5431 Sciatica, right side: Secondary | ICD-10-CM | POA: Diagnosis not present

## 2020-05-12 DIAGNOSIS — I10 Essential (primary) hypertension: Secondary | ICD-10-CM | POA: Diagnosis not present

## 2020-07-14 ENCOUNTER — Encounter (INDEPENDENT_AMBULATORY_CARE_PROVIDER_SITE_OTHER): Payer: Medicare HMO | Admitting: Ophthalmology

## 2020-07-14 ENCOUNTER — Other Ambulatory Visit: Payer: Self-pay

## 2020-07-14 DIAGNOSIS — H35033 Hypertensive retinopathy, bilateral: Secondary | ICD-10-CM | POA: Diagnosis not present

## 2020-07-14 DIAGNOSIS — I10 Essential (primary) hypertension: Secondary | ICD-10-CM | POA: Diagnosis not present

## 2020-07-14 DIAGNOSIS — H353114 Nonexudative age-related macular degeneration, right eye, advanced atrophic with subfoveal involvement: Secondary | ICD-10-CM | POA: Diagnosis not present

## 2020-07-14 DIAGNOSIS — H43813 Vitreous degeneration, bilateral: Secondary | ICD-10-CM

## 2020-07-14 DIAGNOSIS — H353221 Exudative age-related macular degeneration, left eye, with active choroidal neovascularization: Secondary | ICD-10-CM | POA: Diagnosis not present

## 2020-08-05 DIAGNOSIS — T2124XA Burn of second degree of lower back, initial encounter: Secondary | ICD-10-CM | POA: Diagnosis not present

## 2020-08-05 DIAGNOSIS — M62830 Muscle spasm of back: Secondary | ICD-10-CM | POA: Diagnosis not present

## 2020-08-05 DIAGNOSIS — M546 Pain in thoracic spine: Secondary | ICD-10-CM | POA: Diagnosis not present

## 2020-08-09 ENCOUNTER — Emergency Department (HOSPITAL_BASED_OUTPATIENT_CLINIC_OR_DEPARTMENT_OTHER): Payer: Medicare HMO

## 2020-08-09 ENCOUNTER — Encounter (HOSPITAL_BASED_OUTPATIENT_CLINIC_OR_DEPARTMENT_OTHER): Payer: Self-pay | Admitting: Emergency Medicine

## 2020-08-09 ENCOUNTER — Emergency Department (HOSPITAL_BASED_OUTPATIENT_CLINIC_OR_DEPARTMENT_OTHER)
Admission: EM | Admit: 2020-08-09 | Discharge: 2020-08-09 | Disposition: A | Discharge status: Home / Self Care | Payer: Medicare HMO | Attending: Emergency Medicine | Admitting: Emergency Medicine

## 2020-08-09 ENCOUNTER — Other Ambulatory Visit: Payer: Self-pay

## 2020-08-09 DIAGNOSIS — Z20822 Contact with and (suspected) exposure to covid-19: Secondary | ICD-10-CM | POA: Diagnosis not present

## 2020-08-09 DIAGNOSIS — Z79899 Other long term (current) drug therapy: Secondary | ICD-10-CM | POA: Insufficient documentation

## 2020-08-09 DIAGNOSIS — I10 Essential (primary) hypertension: Secondary | ICD-10-CM | POA: Diagnosis not present

## 2020-08-09 DIAGNOSIS — E119 Type 2 diabetes mellitus without complications: Secondary | ICD-10-CM | POA: Insufficient documentation

## 2020-08-09 DIAGNOSIS — J9811 Atelectasis: Secondary | ICD-10-CM | POA: Diagnosis not present

## 2020-08-09 DIAGNOSIS — R079 Chest pain, unspecified: Secondary | ICD-10-CM | POA: Diagnosis not present

## 2020-08-09 DIAGNOSIS — K5730 Diverticulosis of large intestine without perforation or abscess without bleeding: Secondary | ICD-10-CM | POA: Diagnosis not present

## 2020-08-09 DIAGNOSIS — Z7984 Long term (current) use of oral hypoglycemic drugs: Secondary | ICD-10-CM | POA: Diagnosis not present

## 2020-08-09 DIAGNOSIS — M545 Low back pain, unspecified: Secondary | ICD-10-CM | POA: Diagnosis not present

## 2020-08-09 DIAGNOSIS — M549 Dorsalgia, unspecified: Secondary | ICD-10-CM | POA: Diagnosis not present

## 2020-08-09 DIAGNOSIS — M546 Pain in thoracic spine: Secondary | ICD-10-CM | POA: Diagnosis not present

## 2020-08-09 HISTORY — DX: Essential (primary) hypertension: I10

## 2020-08-09 HISTORY — DX: Type 2 diabetes mellitus without complications: E11.9

## 2020-08-09 LAB — URINALYSIS, ROUTINE W REFLEX MICROSCOPIC
Bilirubin Urine: NEGATIVE
Glucose, UA: NEGATIVE mg/dL
Hgb urine dipstick: NEGATIVE
Ketones, ur: NEGATIVE mg/dL
Nitrite: POSITIVE — AB
Protein, ur: NEGATIVE mg/dL
Specific Gravity, Urine: 1.013 (ref 1.005–1.030)
pH: 5 (ref 5.0–8.0)

## 2020-08-09 LAB — BASIC METABOLIC PANEL
Anion gap: 11 (ref 5–15)
BUN: 18 mg/dL (ref 8–23)
CO2: 22 mmol/L (ref 22–32)
Calcium: 9.3 mg/dL (ref 8.9–10.3)
Chloride: 102 mmol/L (ref 98–111)
Creatinine, Ser: 0.89 mg/dL (ref 0.44–1.00)
GFR, Estimated: >60 mL/min (ref >60)
Glucose, Bld: 272 mg/dL — ABNORMAL HIGH (ref 70–99)
Potassium: 4.3 mmol/L (ref 3.5–5.1)
Sodium: 135 mmol/L (ref 135–145)

## 2020-08-09 LAB — CBC WITH DIFFERENTIAL/PLATELET
Abs Immature Granulocytes: 0.04 10*3/uL (ref 0.00–0.07)
Basophils Absolute: 0 10*3/uL (ref 0.0–0.1)
Basophils Relative: 0 %
Eosinophils Absolute: 0 10*3/uL (ref 0.0–0.5)
Eosinophils Relative: 0 %
HCT: 40 % (ref 36.0–46.0)
Hemoglobin: 12.9 g/dL (ref 12.0–15.0)
Immature Granulocytes: 1 %
Lymphocytes Relative: 23 %
Lymphs Abs: 1.6 10*3/uL (ref 0.7–4.0)
MCH: 28.5 pg (ref 26.0–34.0)
MCHC: 32.3 g/dL (ref 30.0–36.0)
MCV: 88.3 fL (ref 80.0–100.0)
Monocytes Absolute: 0.2 10*3/uL (ref 0.1–1.0)
Monocytes Relative: 3 %
Neutro Abs: 5.2 10*3/uL (ref 1.7–7.7)
Neutrophils Relative %: 73 %
Platelets: 307 10*3/uL (ref 150–400)
RBC: 4.53 MIL/uL (ref 3.87–5.11)
RDW: 14 % (ref 11.5–15.5)
WBC: 7.1 10*3/uL (ref 4.0–10.5)
nRBC: 0 % (ref 0.0–0.2)

## 2020-08-09 LAB — HEPATIC FUNCTION PANEL
ALT: 12 U/L (ref 0–44)
AST: 16 U/L (ref 15–41)
Albumin: 4.2 g/dL (ref 3.5–5.0)
Alkaline Phosphatase: 79 U/L (ref 38–126)
Bilirubin, Direct: 0.1 mg/dL (ref 0.0–0.2)
Indirect Bilirubin: 0.4 mg/dL (ref 0.3–0.9)
Total Bilirubin: 0.5 mg/dL (ref 0.3–1.2)
Total Protein: 7.2 g/dL (ref 6.5–8.1)

## 2020-08-09 LAB — RESP PANEL BY RT-PCR (FLU A&B, COVID) ARPGX2
Influenza A by PCR: NEGATIVE
Influenza B by PCR: NEGATIVE
SARS Coronavirus 2 by RT PCR: NEGATIVE

## 2020-08-09 LAB — TROPONIN I (HIGH SENSITIVITY)
Troponin I (High Sensitivity): 4 ng/L (ref <18)
Troponin I (High Sensitivity): 3 ng/L (ref <18)

## 2020-08-09 LAB — CBG MONITORING, ED: Glucose-Capillary: 254 mg/dL — ABNORMAL HIGH (ref 70–99)

## 2020-08-09 LAB — LIPASE, BLOOD: Lipase: 22 U/L (ref 11–51)

## 2020-08-09 MED ORDER — NAPROXEN 500 MG PO TABS: 500.0000 mg | ORAL_TABLET | Freq: Two times a day (BID) | ORAL | 0 refills | Status: AC | PRN

## 2020-08-09 MED ORDER — HYDROMORPHONE HCL 1 MG/ML IJ SOLN
0.5000 mg | Freq: Once | INTRAMUSCULAR | Status: AC
  Administered 2020-08-09: 0.5 mg via INTRAVENOUS
  Filled 2020-08-09: qty 1

## 2020-08-09 MED ORDER — CYCLOBENZAPRINE HCL 10 MG PO TABS: 10.0000 mg | ORAL_TABLET | Freq: Two times a day (BID) | ORAL | 0 refills | Status: DC | PRN

## 2020-08-09 MED ORDER — HYDROCORTISONE NA SUCCINATE PF 250 MG IJ SOLR: 200.0000 mg | Freq: Once | INTRAMUSCULAR | Status: DC

## 2020-08-09 MED ORDER — IOHEXOL 350 MG/ML SOLN
100.0000 mL | Freq: Once | INTRAVENOUS | Status: AC | PRN
  Administered 2020-08-09: 100 mL via INTRAVENOUS

## 2020-08-09 MED ORDER — FENTANYL CITRATE (PF) 100 MCG/2ML IJ SOLN
25.0000 ug | Freq: Once | INTRAMUSCULAR | Status: DC
Start: 2020-08-09 — End: 2020-08-10

## 2020-08-09 MED ORDER — LIDOCAINE VISCOUS HCL 2 % MT SOLN
15.0000 mL | Freq: Once | OROMUCOSAL | Status: AC
  Administered 2020-08-09: 15 mL via ORAL
  Filled 2020-08-09: qty 15

## 2020-08-09 MED ORDER — DIPHENHYDRAMINE HCL 50 MG/ML IJ SOLN
50.0000 mg | Freq: Once | INTRAMUSCULAR | Status: AC
  Administered 2020-08-09: 50 mg via INTRAVENOUS
  Filled 2020-08-09: qty 1

## 2020-08-09 MED ORDER — FENTANYL CITRATE (PF) 100 MCG/2ML IJ SOLN
50.0000 ug | Freq: Once | INTRAMUSCULAR | Status: AC
  Administered 2020-08-09: 50 ug via INTRAVENOUS
  Filled 2020-08-09: qty 2

## 2020-08-09 MED ORDER — OXYCODONE-ACETAMINOPHEN 5-325 MG PO TABS
1.0000 | ORAL_TABLET | Freq: Once | ORAL | Status: AC
  Administered 2020-08-09: 1 via ORAL
  Filled 2020-08-09: qty 1

## 2020-08-09 MED ORDER — DIPHENHYDRAMINE HCL 25 MG PO CAPS: 50.0000 mg | ORAL_CAPSULE | Freq: Once | ORAL | Status: AC

## 2020-08-09 MED ORDER — ALUM & MAG HYDROXIDE-SIMETH 200-200-20 MG/5ML PO SUSP
30.0000 mL | Freq: Once | ORAL | Status: AC
  Administered 2020-08-09: 30 mL via ORAL
  Filled 2020-08-09: qty 30

## 2020-08-09 MED ORDER — HYDROCORTISONE NA SUCCINATE PF 100 MG IJ SOLR
200.0000 mg | Freq: Once | INTRAMUSCULAR | Status: AC
  Administered 2020-08-09: 200 mg via INTRAVENOUS
  Filled 2020-08-09: qty 4
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
## 2020-08-09 NOTE — ED Provider Notes (Signed)
[facility] EMERGENCY DEPT Provider Note   CSN: [id number] Arrival date & time: 08/09/20  1444     History Chief Complaint  Patient presents with  . Back Pain    Priscilla Adams is a 70 y.o. female.  Presents to ER with concern for back pain.  Patient states that she been having back pain for the past few days.  Initially mild to moderate but now more severe.  No alleviating factors, not positional.  Relatively constant at present.  Described as sharp stabbing pain.  No difficulty in breathing, no chest pain.  No change with cough or deep breath.  Does have history of diabetes, hypertension.  HPI     Past Medical History:  Diagnosis Date  . Diabetes mellitus without complication (HCC)   . Hypertension     Patient Active Problem List   Diagnosis Date Noted  . Low back pain 02/07/2019  . Trochanteric bursitis, right hip 01/20/2019    Past Surgical History:  Procedure Laterality Date  . appendectomy    . CHOLECYSTECTOMY    . FEMUR SURGERY    . PARTIAL HYSTERECTOMY       OB History   No obstetric history on file.     History reviewed. No pertinent family history.  Social History   Tobacco Use  . Smoking status: Never Smoker  . Smokeless tobacco: Never Used    Home Medications Prior to Admission medications   Medication Sig Start Date End Date Taking? Authorizing Provider  acarbose (PRECOSE) 100 MG tablet Take 100 mg by mouth 3 (three) times daily. 07/26/20  Yes [provider]  atorvastatin (LIPITOR) 40 MG tablet  11/08/18  Yes [provider]  glipiZIDE (GLUCOTROL) 10 MG tablet  12/25/18  Yes [provider]  lisinopril (ZESTRIL) 10 MG tablet  12/09/18  Yes [provider]  metFORMIN (GLUCOPHAGE) 1000 MG tablet  11/08/18  Yes [provider]  ACCU-CHEK AVIVA PLUS test strip  05/27/19   [provider]  acetaminophen-codeine (TYLENOL #3) 300-30 MG tablet  12/30/18   [provider]   ibuprofen (ADVIL) 800 MG tablet  01/08/19   [provider]  ondansetron (ZOFRAN) 4 MG tablet  05/14/19   [provider]    Allergies    Contrast media [iodinated diagnostic agents] and Penicillins  Review of Systems   Review of Systems  Constitutional: Negative for chills and fever.  HENT: Negative for ear pain and sore throat.   Eyes: Negative for pain and visual disturbance.  Respiratory: Negative for cough, shortness of breath and stridor.   Cardiovascular: Negative for chest pain and palpitations.  Gastrointestinal: Negative for abdominal pain and vomiting.  Genitourinary: Negative for dysuria and hematuria.  Musculoskeletal: Positive for back pain. Negative for arthralgias.  Skin: Negative for color change and rash.  Neurological: Negative for seizures and syncope.  All other systems reviewed and are negative.   Physical Exam Updated Vital Signs BP (!) 109/59   Pulse (!) 55   Temp 99.2 F (37.3 C) (Oral)   Resp 15   Ht 5\' 7"  (1.702 m)   Wt 99.8 kg   SpO2 98%   BMI 34.46 kg/m   Physical Exam Vitals and nursing note reviewed.  Constitutional:      General: She is not in acute distress.    Appearance: She is well-developed.  HENT:     Head: Normocephalic and atraumatic.  Eyes:     Conjunctiva/sclera: Conjunctivae normal.  Cardiovascular:  Rate and Rhythm: Normal rate and regular rhythm.     Heart sounds: No murmur heard.   Pulmonary:     Effort: Pulmonary effort is normal. No respiratory distress.     Breath sounds: Normal breath sounds.  Abdominal:     Palpations: Abdomen is soft.     Tenderness: There is no abdominal tenderness.  Musculoskeletal:        General: No deformity or signs of injury.     Cervical back: Neck supple.     Comments: No midline C, T, L-spine tenderness, no step-off or deformity  Skin:    General: Skin is warm and dry.  Neurological:     Mental Status: She is alert.     ED Results / Procedures /  Treatments   Labs (all labs ordered are listed, but only abnormal results are displayed) Labs Reviewed  BASIC METABOLIC PANEL - Abnormal; Notable for the following components:      Result Value   Glucose, Bld 272 (*)    All other components within normal limits  URINALYSIS, ROUTINE W REFLEX MICROSCOPIC - Abnormal; Notable for the following components:   Nitrite POSITIVE (*)    Leukocytes,Ua MODERATE (*)    Bacteria, UA MANY (*)    All other components within normal limits  RESP PANEL BY RT-PCR (FLU A&B, COVID) ARPGX2  CBC WITH DIFFERENTIAL/PLATELET  HEPATIC FUNCTION PANEL  LIPASE, BLOOD  TROPONIN I (HIGH SENSITIVITY)  TROPONIN I (HIGH SENSITIVITY)    EKG None  Radiology DG Chest Portable 1 View  Result Date: 08/09/2020 CLINICAL DATA:  Back pain for 4 days from upper RIGHT back to mid back, throbbing shooting pain, recently burn back with a heating pad 5 days ago EXAM: PORTABLE CHEST 1 VIEW COMPARISON:  Portable exam 1547 hours compared to partial exam of 09/13/2019 (lateral view only) FINDINGS: Normal heart size, mediastinal contours, and pulmonary vascularity. Minimal subsegmental atelectasis at LEFT base. Remaining lungs clear. No pulmonary infiltrate, pleural effusion, or pneumothorax. Osseous structures unremarkable. IMPRESSION: Minimal LEFT basilar atelectasis. Electronically Signed   By: [name] M.D.   On: 08/09/2020 15:58    Procedures Procedures   Medications Ordered in ED Medications  fentaNYL (SUBLIMAZE) injection 50 mcg (50 mcg Intravenous Given 08/09/20 1602)  diphenhydrAMINE (BENADRYL) capsule 50 mg ( Oral See Alternative 08/09/20 1920)    Or  diphenhydrAMINE (BENADRYL) injection 50 mg (50 mg Intravenous Given 08/09/20 1920)  hydrocortisone sodium succinate (SOLU-CORTEF) 100 MG injection 200 mg (200 mg Intravenous Given 08/09/20 1630)  HYDROmorphone (DILAUDID) injection 0.5 mg (0.5 mg Intravenous Given 08/09/20 1747)    ED Course  I have reviewed the triage vital  signs and the nursing notes.  Pertinent labs & imaging results that were available during my care of the patient were reviewed by me and considered in my medical decision making (see chart for details).    MDM Rules/Calculators/A&P                         70 year old lady presenting to ER with concern for atraumatic upper back pain radiating to lower back.  On exam she initially appeared uncomfortable but not in distress.  Vitals were stable.  Given no shortness of breath no hypoxia, lower suspicion for PE.  Given description of pain, feel prudent to rule out acute aortic pathology.  Has contrast dye allergy.  Followed by allergy protocol.  CT imaging was negative for dissection or pulmonary embolus.  No fracture or  dislocation noted.  Radiologist commented on mild esophageal wall thickening.  Given the reassuring work-up, ultimately suspect most likely MSK strain in etiology.  Symptoms are well controlled at present.  Recommend she follow-up with primary care doctor for close recheck.  Has appointment on Thursday.  Reviewed return precautions and discharged home with daughter.   After the discussed management above, the patient was determined to be safe for discharge.  The patient was in agreement with this plan and all questions regarding their care were answered.  ED return precautions were discussed and the patient will return to the ED with any significant worsening of condition.   Final Clinical Impression(s) / ED Diagnoses Final diagnoses:  Back pain, unspecified back location, unspecified back pain laterality, unspecified chronicity    Rx / DC Orders ED Discharge Orders    None       Milagros Loll, MD 08/09/20 2218

## 2020-08-09 NOTE — Discharge Instructions (Addendum)
Please follow-up with primary care doctor as discussed.  Take prescribed Flexeril and naproxen as needed for pain and spasms.  If you develop any chest pain, worsening back pain, numbness, weakness, fevers, difficulty breathing or other new concerning symptom, come back to ER for reassessment.

## 2020-08-09 NOTE — ED Notes (Signed)
Patient aware that urine sample is needed. Pt reports she will let staff know when she needs to pee

## 2020-08-12 DIAGNOSIS — I10 Essential (primary) hypertension: Secondary | ICD-10-CM | POA: Diagnosis not present

## 2020-08-12 DIAGNOSIS — E7849 Other hyperlipidemia: Secondary | ICD-10-CM | POA: Diagnosis not present

## 2020-08-12 DIAGNOSIS — M5431 Sciatica, right side: Secondary | ICD-10-CM | POA: Diagnosis not present

## 2020-08-12 DIAGNOSIS — Z6833 Body mass index (BMI) 33.0-33.9, adult: Secondary | ICD-10-CM | POA: Diagnosis not present

## 2020-09-02 DIAGNOSIS — E7849 Other hyperlipidemia: Secondary | ICD-10-CM | POA: Diagnosis not present

## 2020-09-02 DIAGNOSIS — I10 Essential (primary) hypertension: Secondary | ICD-10-CM | POA: Diagnosis not present

## 2020-10-26 ENCOUNTER — Other Ambulatory Visit: Payer: Self-pay

## 2020-10-26 ENCOUNTER — Encounter (INDEPENDENT_AMBULATORY_CARE_PROVIDER_SITE_OTHER): Payer: Medicare HMO | Admitting: Ophthalmology

## 2020-10-26 DIAGNOSIS — H353221 Exudative age-related macular degeneration, left eye, with active choroidal neovascularization: Secondary | ICD-10-CM | POA: Diagnosis not present

## 2020-10-26 DIAGNOSIS — H353112 Nonexudative age-related macular degeneration, right eye, intermediate dry stage: Secondary | ICD-10-CM | POA: Diagnosis not present

## 2020-10-26 DIAGNOSIS — H35033 Hypertensive retinopathy, bilateral: Secondary | ICD-10-CM

## 2020-10-26 DIAGNOSIS — H43813 Vitreous degeneration, bilateral: Secondary | ICD-10-CM | POA: Diagnosis not present

## 2020-10-26 DIAGNOSIS — I10 Essential (primary) hypertension: Secondary | ICD-10-CM | POA: Diagnosis not present

## 2020-10-27 ENCOUNTER — Encounter (INDEPENDENT_AMBULATORY_CARE_PROVIDER_SITE_OTHER): Payer: Medicare HMO | Admitting: Ophthalmology

## 2020-11-05 DIAGNOSIS — M549 Dorsalgia, unspecified: Secondary | ICD-10-CM | POA: Diagnosis not present

## 2020-11-11 DIAGNOSIS — Z6833 Body mass index (BMI) 33.0-33.9, adult: Secondary | ICD-10-CM | POA: Diagnosis not present

## 2020-11-11 DIAGNOSIS — M5431 Sciatica, right side: Secondary | ICD-10-CM | POA: Diagnosis not present

## 2020-11-11 DIAGNOSIS — E7849 Other hyperlipidemia: Secondary | ICD-10-CM | POA: Diagnosis not present

## 2020-11-11 DIAGNOSIS — I10 Essential (primary) hypertension: Secondary | ICD-10-CM | POA: Diagnosis not present

## 2020-11-11 DIAGNOSIS — E1165 Type 2 diabetes mellitus with hyperglycemia: Secondary | ICD-10-CM | POA: Diagnosis not present

## 2020-11-11 DIAGNOSIS — I700 Atherosclerosis of aorta: Secondary | ICD-10-CM | POA: Diagnosis not present

## 2021-02-01 ENCOUNTER — Encounter (INDEPENDENT_AMBULATORY_CARE_PROVIDER_SITE_OTHER): Payer: Medicare HMO | Admitting: Ophthalmology

## 2021-02-09 ENCOUNTER — Other Ambulatory Visit: Payer: Self-pay

## 2021-02-09 ENCOUNTER — Encounter (INDEPENDENT_AMBULATORY_CARE_PROVIDER_SITE_OTHER): Payer: Medicare HMO | Admitting: Ophthalmology

## 2021-02-09 DIAGNOSIS — H353221 Exudative age-related macular degeneration, left eye, with active choroidal neovascularization: Secondary | ICD-10-CM | POA: Diagnosis not present

## 2021-02-09 DIAGNOSIS — H43813 Vitreous degeneration, bilateral: Secondary | ICD-10-CM | POA: Diagnosis not present

## 2021-02-09 DIAGNOSIS — H353112 Nonexudative age-related macular degeneration, right eye, intermediate dry stage: Secondary | ICD-10-CM

## 2021-02-09 DIAGNOSIS — I10 Essential (primary) hypertension: Secondary | ICD-10-CM

## 2021-02-09 DIAGNOSIS — H35033 Hypertensive retinopathy, bilateral: Secondary | ICD-10-CM | POA: Diagnosis not present

## 2021-02-10 DIAGNOSIS — Z6833 Body mass index (BMI) 33.0-33.9, adult: Secondary | ICD-10-CM | POA: Diagnosis not present

## 2021-02-10 DIAGNOSIS — I10 Essential (primary) hypertension: Secondary | ICD-10-CM | POA: Diagnosis not present

## 2021-02-10 DIAGNOSIS — M5431 Sciatica, right side: Secondary | ICD-10-CM | POA: Diagnosis not present

## 2021-02-10 DIAGNOSIS — E1165 Type 2 diabetes mellitus with hyperglycemia: Secondary | ICD-10-CM | POA: Diagnosis not present

## 2021-02-10 DIAGNOSIS — I700 Atherosclerosis of aorta: Secondary | ICD-10-CM | POA: Diagnosis not present

## 2021-02-10 DIAGNOSIS — E7849 Other hyperlipidemia: Secondary | ICD-10-CM | POA: Diagnosis not present

## 2021-02-10 DIAGNOSIS — Z0000 Encounter for general adult medical examination without abnormal findings: Secondary | ICD-10-CM | POA: Diagnosis not present

## 2021-02-21 DIAGNOSIS — Z1231 Encounter for screening mammogram for malignant neoplasm of breast: Secondary | ICD-10-CM | POA: Diagnosis not present

## 2021-03-02 DIAGNOSIS — J09X9 Influenza due to identified novel influenza A virus with other manifestations: Secondary | ICD-10-CM | POA: Diagnosis not present

## 2021-03-02 DIAGNOSIS — Z6833 Body mass index (BMI) 33.0-33.9, adult: Secondary | ICD-10-CM | POA: Diagnosis not present

## 2021-05-11 ENCOUNTER — Other Ambulatory Visit: Payer: Self-pay

## 2021-05-11 ENCOUNTER — Encounter (INDEPENDENT_AMBULATORY_CARE_PROVIDER_SITE_OTHER): Payer: Medicare HMO | Admitting: Ophthalmology

## 2021-05-11 DIAGNOSIS — H353221 Exudative age-related macular degeneration, left eye, with active choroidal neovascularization: Secondary | ICD-10-CM | POA: Diagnosis not present

## 2021-05-11 DIAGNOSIS — H35033 Hypertensive retinopathy, bilateral: Secondary | ICD-10-CM | POA: Diagnosis not present

## 2021-05-11 DIAGNOSIS — H353112 Nonexudative age-related macular degeneration, right eye, intermediate dry stage: Secondary | ICD-10-CM

## 2021-05-11 DIAGNOSIS — H43813 Vitreous degeneration, bilateral: Secondary | ICD-10-CM

## 2021-05-11 DIAGNOSIS — I10 Essential (primary) hypertension: Secondary | ICD-10-CM

## 2021-05-18 DIAGNOSIS — Z6833 Body mass index (BMI) 33.0-33.9, adult: Secondary | ICD-10-CM | POA: Diagnosis not present

## 2021-05-18 DIAGNOSIS — I10 Essential (primary) hypertension: Secondary | ICD-10-CM | POA: Diagnosis not present

## 2021-05-18 DIAGNOSIS — E1165 Type 2 diabetes mellitus with hyperglycemia: Secondary | ICD-10-CM | POA: Diagnosis not present

## 2021-05-18 DIAGNOSIS — E1169 Type 2 diabetes mellitus with other specified complication: Secondary | ICD-10-CM | POA: Diagnosis not present

## 2021-05-18 DIAGNOSIS — G43111 Migraine with aura, intractable, with status migrainosus: Secondary | ICD-10-CM | POA: Diagnosis not present

## 2021-05-18 DIAGNOSIS — J09X9 Influenza due to identified novel influenza A virus with other manifestations: Secondary | ICD-10-CM | POA: Diagnosis not present

## 2021-05-18 DIAGNOSIS — Z0000 Encounter for general adult medical examination without abnormal findings: Secondary | ICD-10-CM | POA: Diagnosis not present

## 2021-06-28 DIAGNOSIS — N10 Acute tubulo-interstitial nephritis: Secondary | ICD-10-CM | POA: Diagnosis not present

## 2021-07-07 IMAGING — MG DIGITAL SCREENING BILAT W/ TOMO W/ CAD
6 of 12 series · 6 of 36 positions shown · non-contrast
Comparison: Previous exam(s).

CLINICAL DATA: Screening.

EXAM:
DIGITAL SCREENING BILATERAL MAMMOGRAM WITH TOMO AND CAD

[L CC synth-2D]
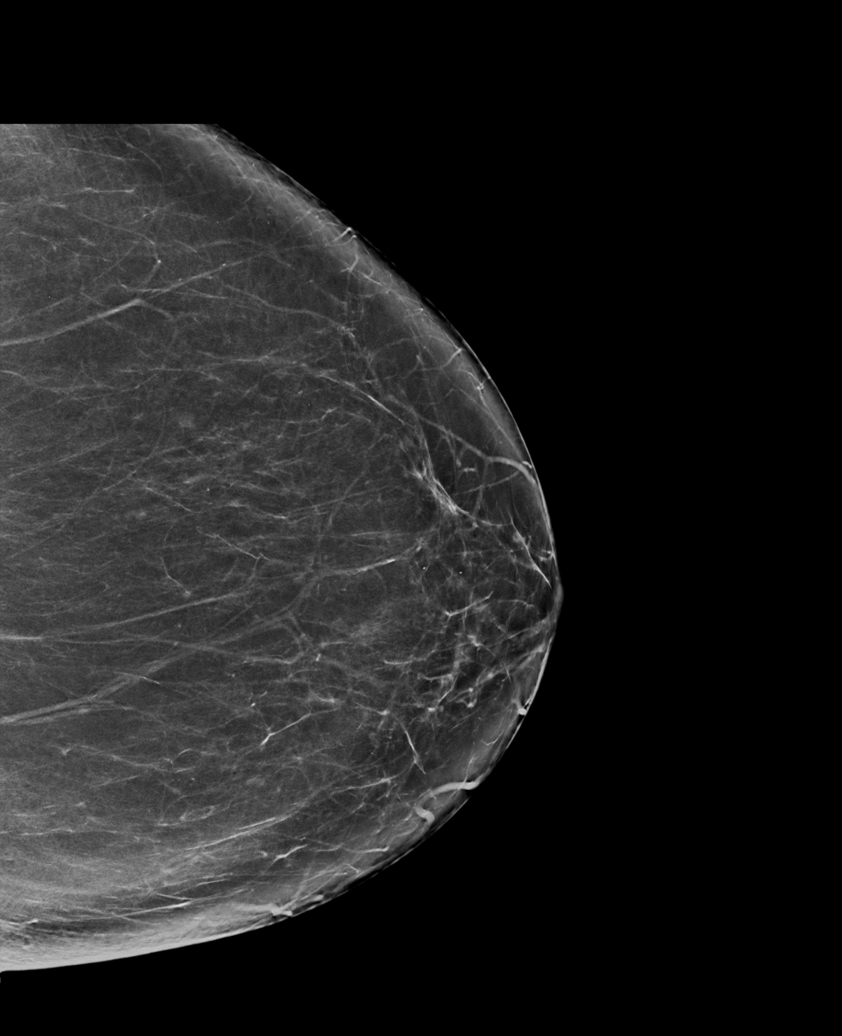

[R CC synth-2D (1 of 2)]
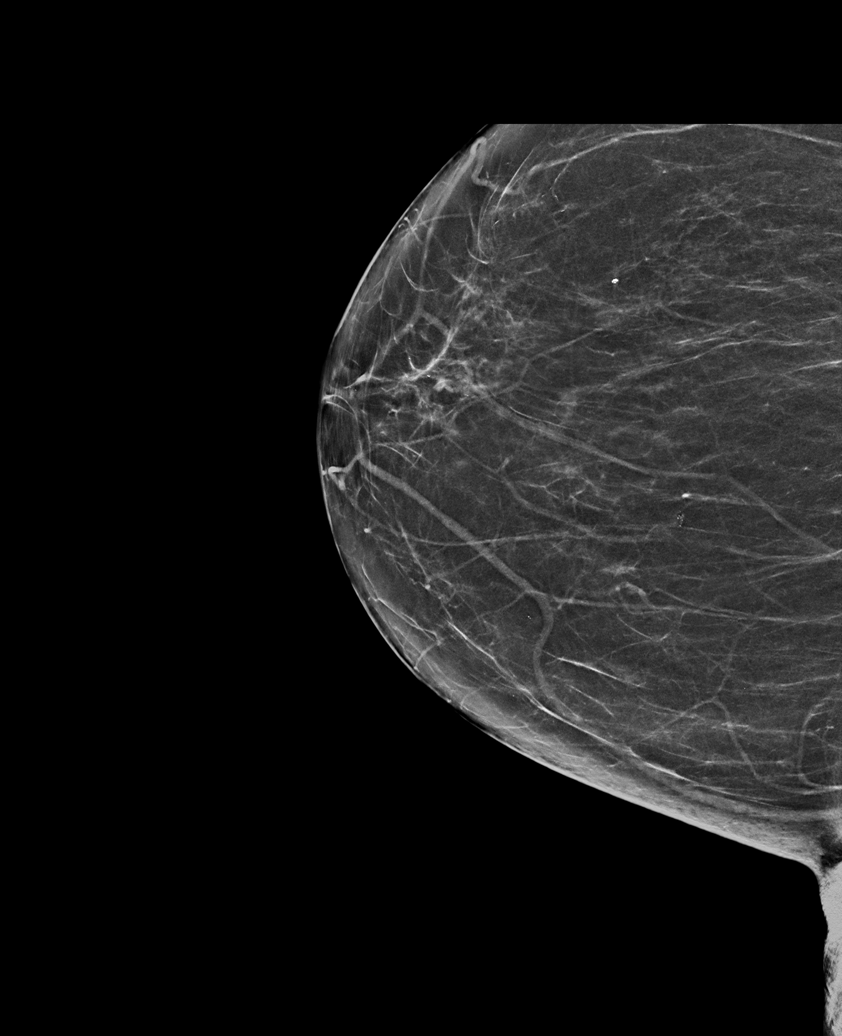

[R MLO synth-2D (1 of 2)]
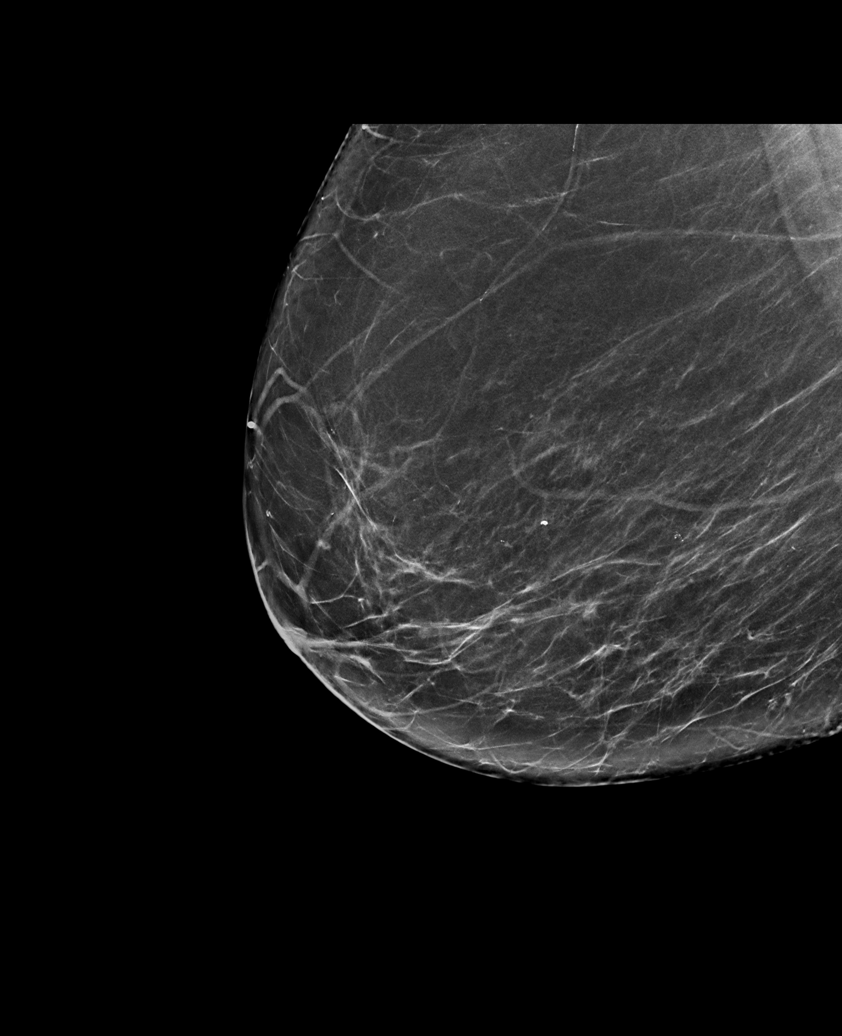

[L MLO synth-2D]
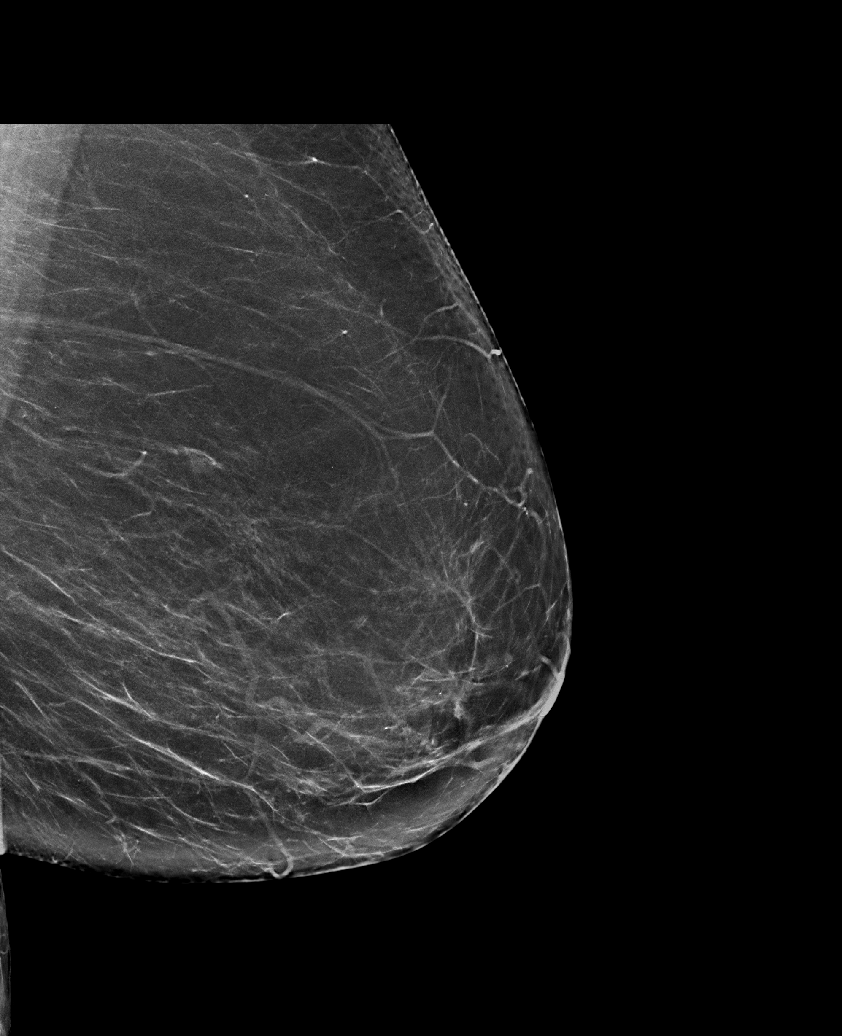

[R CC synth-2D (2 of 2)]
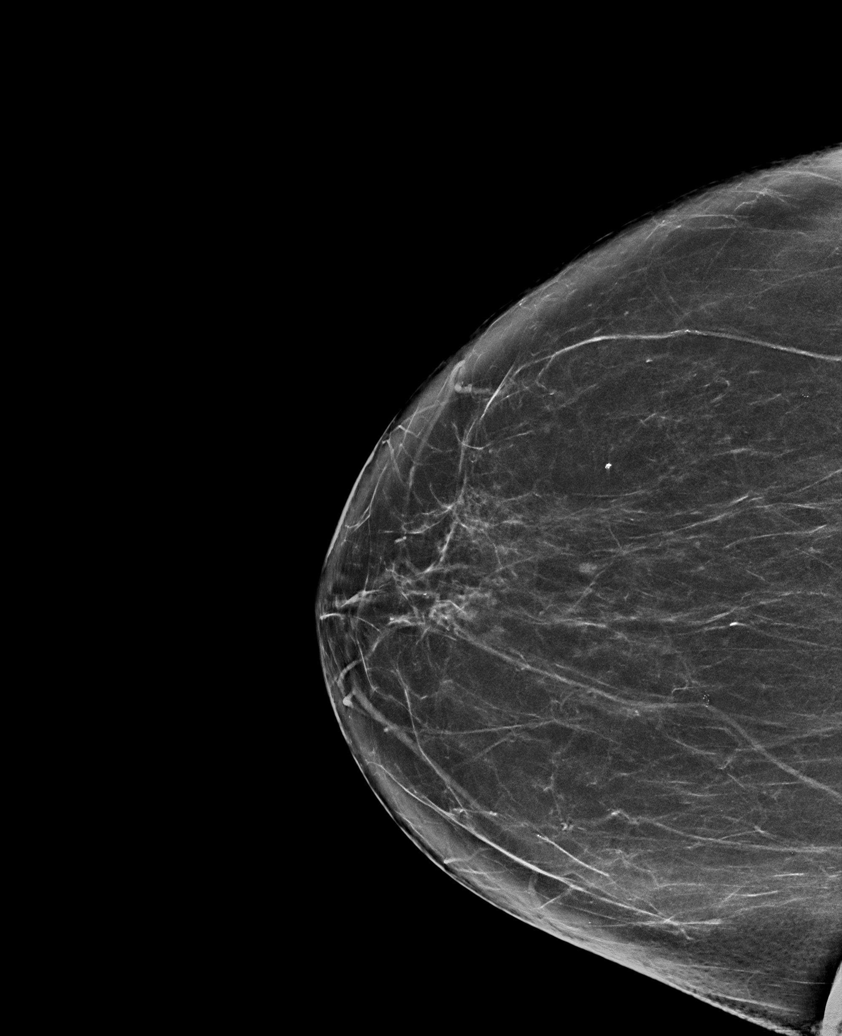

[R MLO synth-2D (2 of 2)]
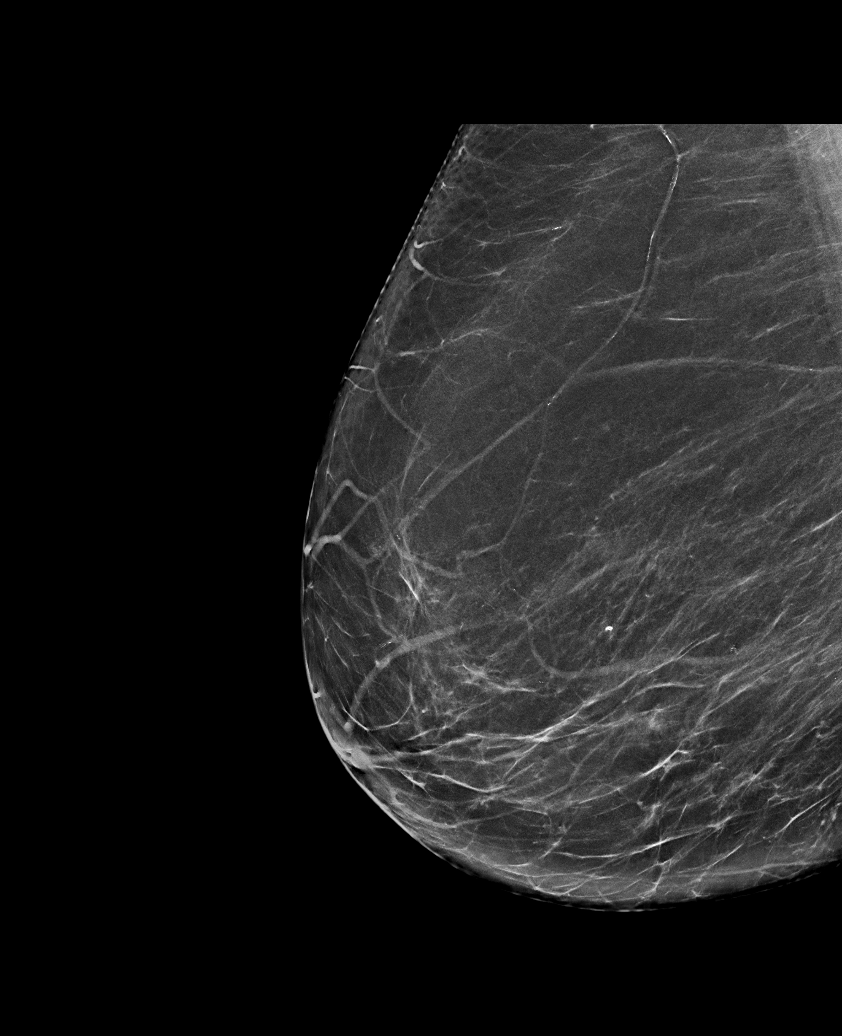

[6 of 36 positions shown; findings below may reference images not displayed]

ACR Breast Density Category b: There are scattered areas of
fibroglandular density.
FINDINGS: In the right breast, calcifications in the posterior lower inner
quadrant warrant further evaluation with magnified views. In the
left breast, no findings suspicious for malignancy. Images were
processed with CAD.
IMPRESSION: Further evaluation is suggested for calcifications in the right
breast.

RECOMMENDATION:
Diagnostic mammogram of the right breast. (Code:[id number])

The patient will be contacted regarding the findings, and additional
imaging will be scheduled.

BI-RADS CATEGORY  0: Incomplete. Need additional imaging evaluation
and/or prior mammograms for comparison.

## 2021-08-10 ENCOUNTER — Encounter (INDEPENDENT_AMBULATORY_CARE_PROVIDER_SITE_OTHER): Payer: Medicare HMO | Admitting: Ophthalmology

## 2021-08-10 DIAGNOSIS — H35033 Hypertensive retinopathy, bilateral: Secondary | ICD-10-CM

## 2021-08-10 DIAGNOSIS — I10 Essential (primary) hypertension: Secondary | ICD-10-CM

## 2021-08-10 DIAGNOSIS — H43813 Vitreous degeneration, bilateral: Secondary | ICD-10-CM

## 2021-08-10 DIAGNOSIS — H353221 Exudative age-related macular degeneration, left eye, with active choroidal neovascularization: Secondary | ICD-10-CM | POA: Diagnosis not present

## 2021-08-10 DIAGNOSIS — H353112 Nonexudative age-related macular degeneration, right eye, intermediate dry stage: Secondary | ICD-10-CM | POA: Diagnosis not present

## 2021-08-17 DIAGNOSIS — M5431 Sciatica, right side: Secondary | ICD-10-CM | POA: Diagnosis not present

## 2021-08-17 DIAGNOSIS — E1169 Type 2 diabetes mellitus with other specified complication: Secondary | ICD-10-CM | POA: Diagnosis not present

## 2021-08-17 DIAGNOSIS — I10 Essential (primary) hypertension: Secondary | ICD-10-CM | POA: Diagnosis not present

## 2021-08-17 DIAGNOSIS — Z6835 Body mass index (BMI) 35.0-35.9, adult: Secondary | ICD-10-CM | POA: Diagnosis not present

## 2021-08-17 DIAGNOSIS — E7849 Other hyperlipidemia: Secondary | ICD-10-CM | POA: Diagnosis not present

## 2021-08-17 DIAGNOSIS — N3941 Urge incontinence: Secondary | ICD-10-CM | POA: Diagnosis not present

## 2021-08-17 DIAGNOSIS — I700 Atherosclerosis of aorta: Secondary | ICD-10-CM | POA: Diagnosis not present

## 2021-11-09 ENCOUNTER — Encounter (INDEPENDENT_AMBULATORY_CARE_PROVIDER_SITE_OTHER): Payer: Medicare HMO | Admitting: Ophthalmology

## 2021-11-25 ENCOUNTER — Encounter (INDEPENDENT_AMBULATORY_CARE_PROVIDER_SITE_OTHER): Payer: Medicare HMO | Admitting: Ophthalmology

## 2021-11-25 DIAGNOSIS — H353231 Exudative age-related macular degeneration, bilateral, with active choroidal neovascularization: Secondary | ICD-10-CM | POA: Diagnosis not present

## 2021-11-25 DIAGNOSIS — I10 Essential (primary) hypertension: Secondary | ICD-10-CM | POA: Diagnosis not present

## 2021-11-25 DIAGNOSIS — H35033 Hypertensive retinopathy, bilateral: Secondary | ICD-10-CM

## 2021-11-25 DIAGNOSIS — H43813 Vitreous degeneration, bilateral: Secondary | ICD-10-CM

## 2021-11-30 DIAGNOSIS — I10 Essential (primary) hypertension: Secondary | ICD-10-CM | POA: Diagnosis not present

## 2021-11-30 DIAGNOSIS — N3941 Urge incontinence: Secondary | ICD-10-CM | POA: Diagnosis not present

## 2021-11-30 DIAGNOSIS — Z0000 Encounter for general adult medical examination without abnormal findings: Secondary | ICD-10-CM | POA: Diagnosis not present

## 2021-11-30 DIAGNOSIS — Z6836 Body mass index (BMI) 36.0-36.9, adult: Secondary | ICD-10-CM | POA: Diagnosis not present

## 2021-11-30 DIAGNOSIS — M5431 Sciatica, right side: Secondary | ICD-10-CM | POA: Diagnosis not present

## 2021-11-30 DIAGNOSIS — E7849 Other hyperlipidemia: Secondary | ICD-10-CM | POA: Diagnosis not present

## 2021-11-30 DIAGNOSIS — I700 Atherosclerosis of aorta: Secondary | ICD-10-CM | POA: Diagnosis not present

## 2021-11-30 DIAGNOSIS — E1169 Type 2 diabetes mellitus with other specified complication: Secondary | ICD-10-CM | POA: Diagnosis not present

## 2021-12-20 ENCOUNTER — Other Ambulatory Visit (HOSPITAL_COMMUNITY): Payer: Self-pay | Admitting: Internal Medicine

## 2021-12-20 DIAGNOSIS — M810 Age-related osteoporosis without current pathological fracture: Secondary | ICD-10-CM

## 2022-01-09 DIAGNOSIS — S63697A Other sprain of left little finger, initial encounter: Secondary | ICD-10-CM | POA: Diagnosis not present

## 2022-01-09 DIAGNOSIS — Z6836 Body mass index (BMI) 36.0-36.9, adult: Secondary | ICD-10-CM | POA: Diagnosis not present

## 2022-01-10 DIAGNOSIS — M7989 Other specified soft tissue disorders: Secondary | ICD-10-CM | POA: Diagnosis not present

## 2022-01-10 DIAGNOSIS — M79641 Pain in right hand: Secondary | ICD-10-CM | POA: Diagnosis not present

## 2022-01-16 ENCOUNTER — Other Ambulatory Visit (HOSPITAL_COMMUNITY): Payer: Medicare HMO

## 2022-01-16 DIAGNOSIS — Z0100 Encounter for examination of eyes and vision without abnormal findings: Secondary | ICD-10-CM | POA: Diagnosis not present

## 2022-01-16 DIAGNOSIS — H524 Presbyopia: Secondary | ICD-10-CM | POA: Diagnosis not present

## 2022-01-16 DIAGNOSIS — H353222 Exudative age-related macular degeneration, left eye, with inactive choroidal neovascularization: Secondary | ICD-10-CM | POA: Diagnosis not present

## 2022-01-25 DIAGNOSIS — M810 Age-related osteoporosis without current pathological fracture: Secondary | ICD-10-CM | POA: Diagnosis not present

## 2022-01-25 DIAGNOSIS — M8589 Other specified disorders of bone density and structure, multiple sites: Secondary | ICD-10-CM | POA: Diagnosis not present

## 2022-02-01 ENCOUNTER — Encounter (INDEPENDENT_AMBULATORY_CARE_PROVIDER_SITE_OTHER): Payer: Medicare HMO | Admitting: Ophthalmology

## 2022-02-01 DIAGNOSIS — H35033 Hypertensive retinopathy, bilateral: Secondary | ICD-10-CM

## 2022-02-01 DIAGNOSIS — I10 Essential (primary) hypertension: Secondary | ICD-10-CM

## 2022-02-01 DIAGNOSIS — H43813 Vitreous degeneration, bilateral: Secondary | ICD-10-CM

## 2022-02-01 DIAGNOSIS — H353231 Exudative age-related macular degeneration, bilateral, with active choroidal neovascularization: Secondary | ICD-10-CM

## 2022-03-07 DIAGNOSIS — Z0000 Encounter for general adult medical examination without abnormal findings: Secondary | ICD-10-CM | POA: Diagnosis not present

## 2022-03-07 DIAGNOSIS — I700 Atherosclerosis of aorta: Secondary | ICD-10-CM | POA: Diagnosis not present

## 2022-03-07 DIAGNOSIS — N3941 Urge incontinence: Secondary | ICD-10-CM | POA: Diagnosis not present

## 2022-03-07 DIAGNOSIS — E1143 Type 2 diabetes mellitus with diabetic autonomic (poly)neuropathy: Secondary | ICD-10-CM | POA: Diagnosis not present

## 2022-03-07 DIAGNOSIS — M5431 Sciatica, right side: Secondary | ICD-10-CM | POA: Diagnosis not present

## 2022-03-07 DIAGNOSIS — E7849 Other hyperlipidemia: Secondary | ICD-10-CM | POA: Diagnosis not present

## 2022-03-07 DIAGNOSIS — I10 Essential (primary) hypertension: Secondary | ICD-10-CM | POA: Diagnosis not present

## 2022-03-07 DIAGNOSIS — Z1231 Encounter for screening mammogram for malignant neoplasm of breast: Secondary | ICD-10-CM | POA: Diagnosis not present

## 2022-03-07 DIAGNOSIS — G43111 Migraine with aura, intractable, with status migrainosus: Secondary | ICD-10-CM | POA: Diagnosis not present

## 2022-03-07 DIAGNOSIS — Z6835 Body mass index (BMI) 35.0-35.9, adult: Secondary | ICD-10-CM | POA: Diagnosis not present

## 2022-04-12 ENCOUNTER — Encounter (INDEPENDENT_AMBULATORY_CARE_PROVIDER_SITE_OTHER): Payer: Medicare HMO | Admitting: Ophthalmology

## 2022-04-12 DIAGNOSIS — H35033 Hypertensive retinopathy, bilateral: Secondary | ICD-10-CM | POA: Diagnosis not present

## 2022-04-12 DIAGNOSIS — H353231 Exudative age-related macular degeneration, bilateral, with active choroidal neovascularization: Secondary | ICD-10-CM | POA: Diagnosis not present

## 2022-04-12 DIAGNOSIS — H43813 Vitreous degeneration, bilateral: Secondary | ICD-10-CM

## 2022-04-12 DIAGNOSIS — I10 Essential (primary) hypertension: Secondary | ICD-10-CM | POA: Diagnosis not present

## 2022-06-01 DIAGNOSIS — M546 Pain in thoracic spine: Secondary | ICD-10-CM | POA: Diagnosis not present

## 2022-06-01 DIAGNOSIS — M62838 Other muscle spasm: Secondary | ICD-10-CM | POA: Diagnosis not present

## 2022-06-01 DIAGNOSIS — M545 Low back pain, unspecified: Secondary | ICD-10-CM | POA: Diagnosis not present

## 2022-06-05 DIAGNOSIS — M5431 Sciatica, right side: Secondary | ICD-10-CM | POA: Diagnosis not present

## 2022-06-05 DIAGNOSIS — I10 Essential (primary) hypertension: Secondary | ICD-10-CM | POA: Diagnosis not present

## 2022-06-05 DIAGNOSIS — Z6834 Body mass index (BMI) 34.0-34.9, adult: Secondary | ICD-10-CM | POA: Diagnosis not present

## 2022-06-05 DIAGNOSIS — E7849 Other hyperlipidemia: Secondary | ICD-10-CM | POA: Diagnosis not present

## 2022-06-05 DIAGNOSIS — G43111 Migraine with aura, intractable, with status migrainosus: Secondary | ICD-10-CM | POA: Diagnosis not present

## 2022-06-05 DIAGNOSIS — E1143 Type 2 diabetes mellitus with diabetic autonomic (poly)neuropathy: Secondary | ICD-10-CM | POA: Diagnosis not present

## 2022-06-05 DIAGNOSIS — N3941 Urge incontinence: Secondary | ICD-10-CM | POA: Diagnosis not present

## 2022-06-05 DIAGNOSIS — I700 Atherosclerosis of aorta: Secondary | ICD-10-CM | POA: Diagnosis not present

## 2022-06-05 DIAGNOSIS — Z0000 Encounter for general adult medical examination without abnormal findings: Secondary | ICD-10-CM | POA: Diagnosis not present

## 2022-06-06 ENCOUNTER — Encounter (INDEPENDENT_AMBULATORY_CARE_PROVIDER_SITE_OTHER): Payer: Self-pay | Admitting: *Deleted

## 2022-06-21 ENCOUNTER — Encounter (INDEPENDENT_AMBULATORY_CARE_PROVIDER_SITE_OTHER): Payer: Medicare HMO | Admitting: Ophthalmology

## 2022-06-21 DIAGNOSIS — Z6834 Body mass index (BMI) 34.0-34.9, adult: Secondary | ICD-10-CM | POA: Diagnosis not present

## 2022-06-21 DIAGNOSIS — K2970 Gastritis, unspecified, without bleeding: Secondary | ICD-10-CM | POA: Diagnosis not present

## 2022-06-21 DIAGNOSIS — I10 Essential (primary) hypertension: Secondary | ICD-10-CM | POA: Diagnosis not present

## 2022-07-04 ENCOUNTER — Encounter (INDEPENDENT_AMBULATORY_CARE_PROVIDER_SITE_OTHER): Payer: Medicare HMO | Admitting: Ophthalmology

## 2022-07-04 DIAGNOSIS — H43813 Vitreous degeneration, bilateral: Secondary | ICD-10-CM | POA: Diagnosis not present

## 2022-07-04 DIAGNOSIS — I10 Essential (primary) hypertension: Secondary | ICD-10-CM

## 2022-07-04 DIAGNOSIS — H353231 Exudative age-related macular degeneration, bilateral, with active choroidal neovascularization: Secondary | ICD-10-CM | POA: Diagnosis not present

## 2022-07-04 DIAGNOSIS — H35033 Hypertensive retinopathy, bilateral: Secondary | ICD-10-CM | POA: Diagnosis not present

## 2022-08-03 DIAGNOSIS — H26492 Other secondary cataract, left eye: Secondary | ICD-10-CM | POA: Diagnosis not present

## 2022-08-03 DIAGNOSIS — H353212 Exudative age-related macular degeneration, right eye, with inactive choroidal neovascularization: Secondary | ICD-10-CM | POA: Diagnosis not present

## 2022-08-03 DIAGNOSIS — H26493 Other secondary cataract, bilateral: Secondary | ICD-10-CM | POA: Diagnosis not present

## 2022-08-03 DIAGNOSIS — H353221 Exudative age-related macular degeneration, left eye, with active choroidal neovascularization: Secondary | ICD-10-CM | POA: Diagnosis not present

## 2022-08-03 DIAGNOSIS — H524 Presbyopia: Secondary | ICD-10-CM | POA: Diagnosis not present

## 2022-08-03 DIAGNOSIS — E113291 Type 2 diabetes mellitus with mild nonproliferative diabetic retinopathy without macular edema, right eye: Secondary | ICD-10-CM | POA: Diagnosis not present

## 2022-08-15 ENCOUNTER — Emergency Department (HOSPITAL_BASED_OUTPATIENT_CLINIC_OR_DEPARTMENT_OTHER)
Admission: EM | Admit: 2022-08-15 | Discharge: 2022-08-15 | Disposition: A | Discharge status: Home / Self Care | Payer: Medicare HMO | Attending: Emergency Medicine | Admitting: Emergency Medicine

## 2022-08-15 ENCOUNTER — Emergency Department (HOSPITAL_BASED_OUTPATIENT_CLINIC_OR_DEPARTMENT_OTHER): Payer: Medicare HMO

## 2022-08-15 ENCOUNTER — Encounter (HOSPITAL_BASED_OUTPATIENT_CLINIC_OR_DEPARTMENT_OTHER): Payer: Self-pay

## 2022-08-15 ENCOUNTER — Other Ambulatory Visit: Payer: Self-pay

## 2022-08-15 DIAGNOSIS — R112 Nausea with vomiting, unspecified: Secondary | ICD-10-CM | POA: Insufficient documentation

## 2022-08-15 DIAGNOSIS — Z7984 Long term (current) use of oral hypoglycemic drugs: Secondary | ICD-10-CM | POA: Insufficient documentation

## 2022-08-15 DIAGNOSIS — E860 Dehydration: Secondary | ICD-10-CM | POA: Diagnosis not present

## 2022-08-15 DIAGNOSIS — I10 Essential (primary) hypertension: Secondary | ICD-10-CM | POA: Diagnosis not present

## 2022-08-15 DIAGNOSIS — N3000 Acute cystitis without hematuria: Secondary | ICD-10-CM | POA: Diagnosis not present

## 2022-08-15 DIAGNOSIS — R109 Unspecified abdominal pain: Secondary | ICD-10-CM | POA: Diagnosis not present

## 2022-08-15 DIAGNOSIS — E119 Type 2 diabetes mellitus without complications: Secondary | ICD-10-CM | POA: Insufficient documentation

## 2022-08-15 DIAGNOSIS — Z79899 Other long term (current) drug therapy: Secondary | ICD-10-CM | POA: Insufficient documentation

## 2022-08-15 DIAGNOSIS — I700 Atherosclerosis of aorta: Secondary | ICD-10-CM | POA: Diagnosis not present

## 2022-08-15 LAB — COMPREHENSIVE METABOLIC PANEL
ALT: <5 U/L (ref 0–44)
AST: 8 U/L — ABNORMAL LOW (ref 15–41)
Albumin: 3.8 g/dL (ref 3.5–5.0)
Alkaline Phosphatase: 66 U/L (ref 38–126)
Anion gap: 12 (ref 5–15)
BUN: 12 mg/dL (ref 8–23)
CO2: 26 mmol/L (ref 22–32)
Calcium: 8.8 mg/dL — ABNORMAL LOW (ref 8.9–10.3)
Chloride: 99 mmol/L (ref 98–111)
Creatinine, Ser: 0.86 mg/dL (ref 0.44–1.00)
GFR, Estimated: >60 mL/min (ref >60)
Glucose, Bld: 174 mg/dL — ABNORMAL HIGH (ref 70–99)
Potassium: 3.1 mmol/L — ABNORMAL LOW (ref 3.5–5.1)
Sodium: 137 mmol/L (ref 135–145)
Total Bilirubin: 1 mg/dL (ref 0.3–1.2)
Total Protein: 6.7 g/dL (ref 6.5–8.1)

## 2022-08-15 LAB — CBC
HCT: 35.1 % — ABNORMAL LOW (ref 36.0–46.0)
Hemoglobin: 11.7 g/dL — ABNORMAL LOW (ref 12.0–15.0)
MCH: 29 pg (ref 26.0–34.0)
MCHC: 33.3 g/dL (ref 30.0–36.0)
MCV: 87.1 fL (ref 80.0–100.0)
Platelets: 226 10*3/uL (ref 150–400)
RBC: 4.03 MIL/uL (ref 3.87–5.11)
RDW: 14.1 % (ref 11.5–15.5)
WBC: 9.7 10*3/uL (ref 4.0–10.5)
nRBC: 0 % (ref 0.0–0.2)

## 2022-08-15 LAB — LIPASE, BLOOD: Lipase: 11 U/L (ref 11–51)

## 2022-08-15 LAB — URINALYSIS, ROUTINE W REFLEX MICROSCOPIC
Bilirubin Urine: NEGATIVE
Glucose, UA: NEGATIVE mg/dL
Ketones, ur: 40 mg/dL — AB
Nitrite: POSITIVE — AB
Protein, ur: 30 mg/dL — AB
Specific Gravity, Urine: 1.016 (ref 1.005–1.030)
WBC, UA: >50 WBC/hpf (ref 0–5)
pH: 6 (ref 5.0–8.0)

## 2022-08-15 LAB — TROPONIN I (HIGH SENSITIVITY): Troponin I (High Sensitivity): 8 ng/L (ref <18)

## 2022-08-15 MED ORDER — SODIUM CHLORIDE 0.9 % IV BOLUS
1000.0000 mL | Freq: Once | INTRAVENOUS | Status: AC
  Administered 2022-08-15: 1000 mL via INTRAVENOUS

## 2022-08-15 MED ORDER — CEPHALEXIN 500 MG PO CAPS: 500.0000 mg | ORAL_CAPSULE | Freq: Three times a day (TID) | ORAL | 0 refills | Status: DC

## 2022-08-15 MED ORDER — SODIUM CHLORIDE 0.9 % IV SOLN
1.0000 g | Freq: Once | INTRAVENOUS | Status: AC
  Administered 2022-08-15: 1 g via INTRAVENOUS
  Filled 2022-08-15: qty 10

## 2022-08-15 MED ORDER — PANTOPRAZOLE SODIUM 40 MG IV SOLR
40.0000 mg | Freq: Once | INTRAVENOUS | Status: AC
  Administered 2022-08-15: 40 mg via INTRAVENOUS
  Filled 2022-08-15: qty 10

## 2022-08-15 MED ORDER — ONDANSETRON HCL 4 MG/2ML IJ SOLN
4.0000 mg | Freq: Once | INTRAMUSCULAR | Status: AC
  Administered 2022-08-15: 4 mg via INTRAVENOUS
  Filled 2022-08-15: qty 2

## 2022-08-15 MED ORDER — MORPHINE SULFATE (PF) 4 MG/ML IV SOLN
4.0000 mg | Freq: Once | INTRAVENOUS | Status: AC
  Administered 2022-08-15: 4 mg via INTRAVENOUS
  Filled 2022-08-15: qty 1

## 2022-08-15 MED ORDER — FAMOTIDINE IN NACL 20-0.9 MG/50ML-% IV SOLN
20.0000 mg | Freq: Once | INTRAVENOUS | Status: AC
  Administered 2022-08-15: 20 mg via INTRAVENOUS
  Filled 2022-08-15: qty 50

## 2022-08-15 MED ORDER — ONDANSETRON 4 MG PO TBDP: ORAL_TABLET | ORAL | 0 refills | Status: DC

## 2022-08-15 NOTE — ED Provider Notes (Signed)
[place] EMERGENCY DEPARTMENT AT [facility] Provider Note   CSN: [id number] Arrival date & time: 08/15/22  1420     History  Chief Complaint  Patient presents with   Abdominal Pain    Priscilla Adams is a 72 y.o. female history of diabetes on Ozempic, hypertension, here presenting with abdominal pain and vomiting.  Patient states that she has not felt well since yesterday.  Patient states that she is unable to keep anything down since yesterday.  She had some black vomit this morning.  Patient denies any diarrhea.  Patient has mild abdominal cramps.  Patient denies any fevers.  Patient states that she recently started on the Ozempic.  Denies any recent travel or eating bad food  The history is provided by the patient.       Home Medications Prior to Admission medications   Medication Sig Start Date End Date Taking? Authorizing Provider  acarbose (PRECOSE) 100 MG tablet Take 100 mg by mouth 3 (three) times daily. 07/26/20   [provider]  ACCU-CHEK AVIVA PLUS test strip  05/27/19   [provider]  acetaminophen-codeine (TYLENOL #3) 300-30 MG tablet  12/30/18   [provider]  atorvastatin (LIPITOR) 40 MG tablet  11/08/18   [provider]  cyclobenzaprine (FLEXERIL) 10 MG tablet Take 1 tablet (10 mg total) by mouth 2 (two) times daily as needed for muscle spasms. 08/09/20   Milagros Loll, MD  glipiZIDE (GLUCOTROL) 10 MG tablet  12/25/18   [provider]  ibuprofen (ADVIL) 800 MG tablet  01/08/19   [provider]  lisinopril (ZESTRIL) 10 MG tablet  12/09/18   [provider]  metFORMIN (GLUCOPHAGE) 1000 MG tablet  11/08/18   [provider]  naproxen (NAPROSYN) 500 MG tablet Take 1 tablet (500 mg total) by mouth 2 (two) times daily as needed for moderate pain. 08/09/20   Milagros Loll, MD  ondansetron [facility]) 4 MG tablet  05/14/19   [provider]      Allergies    Contrast media  [iodinated contrast media] and Penicillins    Review of Systems   Review of Systems  Gastrointestinal:  Positive for abdominal pain.  All other systems reviewed and are negative.   Physical Exam Updated Vital Signs BP 96/72 (BP Location: Right Arm)   Pulse 73   Temp 98.3 F (36.8 C) (Oral)   Resp 18   Ht 5\' 7"  (1.702 m)   Wt 90.7 kg   SpO2 98%   BMI 31.32 kg/m  Physical Exam Vitals and nursing note reviewed.  Constitutional:      Comments: Dehydrated   HENT:     Head: Normocephalic.     Mouth/Throat:     Pharynx: Oropharynx is clear.  Eyes:     Extraocular Movements: Extraocular movements intact.     Pupils: Pupils are equal, round, and reactive to light.  Cardiovascular:     Rate and Rhythm: Normal rate and regular rhythm.  Pulmonary:     Effort: Pulmonary effort is normal.     Breath sounds: Normal breath sounds.  Abdominal:     General: Abdomen is flat.     Comments: Mild epigastric tenderness   Skin:    General: Skin is warm.     Capillary Refill: Capillary refill takes less than 2 seconds.  Neurological:     General: No focal deficit present.     Mental Status: She is oriented to person, place, and time.  Psychiatric:        Mood and Affect: Mood normal.     ED Results / Procedures / Treatments   Labs (all labs ordered are listed, but only abnormal results are displayed) Labs Reviewed  URINALYSIS, ROUTINE W REFLEX MICROSCOPIC - Abnormal; Notable for the following components:      Result Value   APPearance HAZY (*)    Hgb urine dipstick SMALL (*)    Ketones, ur 40 (*)    Protein, ur 30 (*)    Nitrite POSITIVE (*)    Leukocytes,Ua LARGE (*)    Bacteria, UA MANY (*)    Non Squamous Epithelial 0-5 (*)    All other components within normal limits  LIPASE, BLOOD  COMPREHENSIVE METABOLIC PANEL  CBC  TROPONIN I (HIGH SENSITIVITY)    EKG None  Radiology No results found.  Procedures Procedures    Medications Ordered in ED Medications   sodium chloride 0.9 % bolus 1,000 mL (has no administration in time range)  ondansetron (ZOFRAN) injection 4 mg (has no administration in time range)  famotidine (PEPCID) IVPB 20 mg premix (has no administration in time range)  pantoprazole (PROTONIX) injection 40 mg (has no administration in time range)  cefTRIAXone (ROCEPHIN) 1 g in sodium chloride 0.9 % 100 mL IVPB (has no administration in time range)    ED Course/ Medical Decision Making/ A&P                             Medical Decision Making Priscilla Adams is a 72 y.o. female here presenting with epigastric pain and black vomit.  Patient has no history of GI bleed and is not on blood thinners.  Concern for possible gastritis versus pancreatitis from Ozempic versus pyelonephritis.  Plan to get CBC and CMP and UA and CT abdomen pelvis.  Patient has contrast dye allergy so we will get noncontrast CT   5:26 PM I reviewed patient's labs and they were unremarkable.  UA showed greater than 50 whites and many bacteria.  Patient does have urinary frequency.  CT abdomen pelvis showed no diverticulitis or colitis or pyelonephritis.  Patient is feeling much better after IV fluids and Rocephin.  At this point she is stable for discharge with Keflex and Zofran.  Problems Addressed: Acute cystitis without hematuria: acute illness or injury Nausea and vomiting, unspecified vomiting type: acute illness or injury  Amount and/or Complexity of Data Reviewed Labs: ordered. Decision-making details documented in ED Course. Radiology: ordered and independent interpretation performed. Decision-making details documented in ED Course. ECG/medicine tests: ordered and independent interpretation performed. Decision-making details documented in ED Course.  Risk Prescription drug management.    Final Clinical Impression(s) / ED Diagnoses Final diagnoses:  None    Rx / DC Orders ED Discharge Orders     None         Charlynne Pander,  MD 08/15/22 1727

## 2022-08-15 NOTE — Discharge Instructions (Signed)
As we discussed, you likely have a urine infection causing your symptoms  I have prescribed Keflex 3 times daily for 5 days  I have also prescribed nausea medicine and please stay hydrated  Follow-up with your doctor   Return to ER if you have worse abdominal pain or vomiting or dehydration

## 2022-08-15 NOTE — ED Triage Notes (Signed)
Patient here POV from Home.  Endorses Multiple Areas of Pain to Neck, Legs, ABD that began yesterday. Noted Black Emesis yesterday which was followed by the pain.  Some Diarrhea as well. Pain in ABD Pain localized across lower ABD.   NAD noted during Triage. A&Ox4. GCS 15. BIB Wheelchair.

## 2022-08-15 NOTE — ED Notes (Signed)
Pt tolerated fluids. Pain has also resolved

## 2022-08-15 NOTE — ED Notes (Signed)
Patient transported to CT 

## 2022-08-15 NOTE — ED Notes (Signed)
RN reviewed discharge instructions with pt. Pt verbalized understanding and had no further questions. VSS upon discharge.  

## 2022-08-17 LAB — URINE CULTURE: Culture: >=100000 — AB

## 2022-08-18 ENCOUNTER — Telehealth (HOSPITAL_BASED_OUTPATIENT_CLINIC_OR_DEPARTMENT_OTHER): Payer: Self-pay | Admitting: Emergency Medicine

## 2022-08-18 ENCOUNTER — Telehealth: Payer: Self-pay

## 2022-08-18 NOTE — Telephone Encounter (Signed)
Post ED Visit - Positive Culture Follow-up  Culture report reviewed by antimicrobial stewardship pharmacist: [facility] []  Enzo Bi, Pharm.D. []  Celedonio Miyamoto, Pharm.D., BCPS AQ-ID []  Garvin Fila, Pharm.D., BCPS []  Georgina Pillion, Pharm.D., BCPS []  [place], [place].D., BCPS, AAHIVP []  Estella Husk, Pharm.D., BCPS, AAHIVP []  Lysle Pearl, PharmD, BCPS []  Phillips Climes, PharmD, BCPS []  Agapito Games, PharmD, BCPS [x]  Andreas Ohm, PharmD []  Mervyn Gay, PharmD, BCPS []  Vinnie Level, PharmD  [facility] []  Len Childs, PharmD []  Greer Pickerel, PharmD []  Adalberto Cole, PharmD []  Perlie Gold, Rph []  Lonell Face) Jean Rosenthal, PharmD []  Earl Many, PharmD []  Junita Push, PharmD []  Dorna Leitz, PharmD []  Terrilee Files, PharmD []  Lynann Beaver, PharmD []  Keturah Barre, PharmD []  Loralee Pacas, PharmD []  Bernadene Person, PharmD   Positive urine culture Treated with Cephalexin, organism sensitive to the same and no further patient follow-up is required at this time.  Pamala Hurry 08/18/2022, 4:23 PM

## 2022-08-18 NOTE — Telephone Encounter (Signed)
Transition Care Management Unsuccessful Follow-up Telephone Call  Date of discharge and from where:  [facility] 6/11  Attempts:  1st Attempt  Reason for unsuccessful TCM follow-up call:  Left voice message   K[facility] Guide, [place] [phone] [address], [place], [place] [address] Phone: [phone] Email: [email or web address]       

## 2022-08-21 ENCOUNTER — Telehealth: Payer: Self-pay

## 2022-08-21 NOTE — Telephone Encounter (Signed)
Transition Care Management Unsuccessful Follow-up Telephone Call  Date of discharge and from where:  [facility] 6/11  Attempts:  2nd Attempt  Reason for unsuccessful TCM follow-up call:  Left voice message   Lenard Forth [facility] Guide, [facility] [phone] [address] [place], [place], [place] [address] Phone: [phone] Email: [email or web address]

## 2022-09-11 DIAGNOSIS — Z6830 Body mass index (BMI) 30.0-30.9, adult: Secondary | ICD-10-CM | POA: Diagnosis not present

## 2022-09-11 DIAGNOSIS — G43111 Migraine with aura, intractable, with status migrainosus: Secondary | ICD-10-CM | POA: Diagnosis not present

## 2022-09-11 DIAGNOSIS — E1143 Type 2 diabetes mellitus with diabetic autonomic (poly)neuropathy: Secondary | ICD-10-CM | POA: Diagnosis not present

## 2022-09-11 DIAGNOSIS — I700 Atherosclerosis of aorta: Secondary | ICD-10-CM | POA: Diagnosis not present

## 2022-09-11 DIAGNOSIS — N3941 Urge incontinence: Secondary | ICD-10-CM | POA: Diagnosis not present

## 2022-09-11 DIAGNOSIS — I10 Essential (primary) hypertension: Secondary | ICD-10-CM | POA: Diagnosis not present

## 2022-09-11 DIAGNOSIS — E7849 Other hyperlipidemia: Secondary | ICD-10-CM | POA: Diagnosis not present

## 2022-09-11 DIAGNOSIS — M5431 Sciatica, right side: Secondary | ICD-10-CM | POA: Diagnosis not present

## 2022-09-12 ENCOUNTER — Encounter (INDEPENDENT_AMBULATORY_CARE_PROVIDER_SITE_OTHER): Payer: Medicare HMO | Admitting: Ophthalmology

## 2022-09-12 DIAGNOSIS — H35033 Hypertensive retinopathy, bilateral: Secondary | ICD-10-CM

## 2022-09-12 DIAGNOSIS — H353231 Exudative age-related macular degeneration, bilateral, with active choroidal neovascularization: Secondary | ICD-10-CM | POA: Diagnosis not present

## 2022-09-12 DIAGNOSIS — H43813 Vitreous degeneration, bilateral: Secondary | ICD-10-CM | POA: Diagnosis not present

## 2022-09-12 DIAGNOSIS — I10 Essential (primary) hypertension: Secondary | ICD-10-CM

## 2022-11-21 ENCOUNTER — Encounter (INDEPENDENT_AMBULATORY_CARE_PROVIDER_SITE_OTHER): Payer: Medicare HMO | Admitting: Ophthalmology

## 2022-11-21 DIAGNOSIS — I10 Essential (primary) hypertension: Secondary | ICD-10-CM

## 2022-11-21 DIAGNOSIS — H35033 Hypertensive retinopathy, bilateral: Secondary | ICD-10-CM | POA: Diagnosis not present

## 2022-11-21 DIAGNOSIS — H43813 Vitreous degeneration, bilateral: Secondary | ICD-10-CM

## 2022-11-21 DIAGNOSIS — H353231 Exudative age-related macular degeneration, bilateral, with active choroidal neovascularization: Secondary | ICD-10-CM | POA: Diagnosis not present

## 2022-12-07 ENCOUNTER — Encounter (INDEPENDENT_AMBULATORY_CARE_PROVIDER_SITE_OTHER): Payer: Self-pay | Admitting: *Deleted

## 2022-12-11 DIAGNOSIS — G43111 Migraine with aura, intractable, with status migrainosus: Secondary | ICD-10-CM | POA: Diagnosis not present

## 2022-12-11 DIAGNOSIS — M5431 Sciatica, right side: Secondary | ICD-10-CM | POA: Diagnosis not present

## 2022-12-11 DIAGNOSIS — E1143 Type 2 diabetes mellitus with diabetic autonomic (poly)neuropathy: Secondary | ICD-10-CM | POA: Diagnosis not present

## 2022-12-11 DIAGNOSIS — E7849 Other hyperlipidemia: Secondary | ICD-10-CM | POA: Diagnosis not present

## 2022-12-11 DIAGNOSIS — Z6829 Body mass index (BMI) 29.0-29.9, adult: Secondary | ICD-10-CM | POA: Diagnosis not present

## 2022-12-11 DIAGNOSIS — I10 Essential (primary) hypertension: Secondary | ICD-10-CM | POA: Diagnosis not present

## 2022-12-11 DIAGNOSIS — N3941 Urge incontinence: Secondary | ICD-10-CM | POA: Diagnosis not present

## 2022-12-11 DIAGNOSIS — I700 Atherosclerosis of aorta: Secondary | ICD-10-CM | POA: Diagnosis not present

## 2023-01-30 ENCOUNTER — Encounter (INDEPENDENT_AMBULATORY_CARE_PROVIDER_SITE_OTHER): Payer: Medicare HMO | Admitting: Ophthalmology

## 2023-01-30 DIAGNOSIS — I10 Essential (primary) hypertension: Secondary | ICD-10-CM | POA: Diagnosis not present

## 2023-01-30 DIAGNOSIS — H35033 Hypertensive retinopathy, bilateral: Secondary | ICD-10-CM | POA: Diagnosis not present

## 2023-01-30 DIAGNOSIS — H43813 Vitreous degeneration, bilateral: Secondary | ICD-10-CM | POA: Diagnosis not present

## 2023-01-30 DIAGNOSIS — H353231 Exudative age-related macular degeneration, bilateral, with active choroidal neovascularization: Secondary | ICD-10-CM

## 2023-02-22 DIAGNOSIS — H35033 Hypertensive retinopathy, bilateral: Secondary | ICD-10-CM | POA: Diagnosis not present

## 2023-02-22 DIAGNOSIS — H524 Presbyopia: Secondary | ICD-10-CM | POA: Diagnosis not present

## 2023-02-22 DIAGNOSIS — H5213 Myopia, bilateral: Secondary | ICD-10-CM | POA: Diagnosis not present

## 2023-02-22 DIAGNOSIS — Z0100 Encounter for examination of eyes and vision without abnormal findings: Secondary | ICD-10-CM | POA: Diagnosis not present

## 2023-03-21 DIAGNOSIS — E7849 Other hyperlipidemia: Secondary | ICD-10-CM | POA: Diagnosis not present

## 2023-03-21 DIAGNOSIS — N182 Chronic kidney disease, stage 2 (mild): Secondary | ICD-10-CM | POA: Diagnosis not present

## 2023-03-21 DIAGNOSIS — E1122 Type 2 diabetes mellitus with diabetic chronic kidney disease: Secondary | ICD-10-CM | POA: Diagnosis not present

## 2023-03-21 DIAGNOSIS — N3941 Urge incontinence: Secondary | ICD-10-CM | POA: Diagnosis not present

## 2023-03-21 DIAGNOSIS — M5431 Sciatica, right side: Secondary | ICD-10-CM | POA: Diagnosis not present

## 2023-03-21 DIAGNOSIS — I700 Atherosclerosis of aorta: Secondary | ICD-10-CM | POA: Diagnosis not present

## 2023-03-21 DIAGNOSIS — Z6831 Body mass index (BMI) 31.0-31.9, adult: Secondary | ICD-10-CM | POA: Diagnosis not present

## 2023-03-21 DIAGNOSIS — I10 Essential (primary) hypertension: Secondary | ICD-10-CM | POA: Diagnosis not present

## 2023-03-21 DIAGNOSIS — G43111 Migraine with aura, intractable, with status migrainosus: Secondary | ICD-10-CM | POA: Diagnosis not present

## 2023-04-05 DIAGNOSIS — Z1231 Encounter for screening mammogram for malignant neoplasm of breast: Secondary | ICD-10-CM | POA: Diagnosis not present

## 2023-04-11 ENCOUNTER — Encounter (INDEPENDENT_AMBULATORY_CARE_PROVIDER_SITE_OTHER): Payer: Medicare HMO | Admitting: Ophthalmology

## 2023-04-11 DIAGNOSIS — I10 Essential (primary) hypertension: Secondary | ICD-10-CM | POA: Diagnosis not present

## 2023-04-11 DIAGNOSIS — H35033 Hypertensive retinopathy, bilateral: Secondary | ICD-10-CM

## 2023-04-11 DIAGNOSIS — H353231 Exudative age-related macular degeneration, bilateral, with active choroidal neovascularization: Secondary | ICD-10-CM | POA: Diagnosis not present

## 2023-04-11 DIAGNOSIS — H43813 Vitreous degeneration, bilateral: Secondary | ICD-10-CM

## 2023-05-08 DIAGNOSIS — M545 Low back pain, unspecified: Secondary | ICD-10-CM | POA: Diagnosis not present

## 2023-06-20 DIAGNOSIS — I10 Essential (primary) hypertension: Secondary | ICD-10-CM | POA: Diagnosis not present

## 2023-06-20 DIAGNOSIS — Z0000 Encounter for general adult medical examination without abnormal findings: Secondary | ICD-10-CM | POA: Diagnosis not present

## 2023-06-20 DIAGNOSIS — G43111 Migraine with aura, intractable, with status migrainosus: Secondary | ICD-10-CM | POA: Diagnosis not present

## 2023-06-20 DIAGNOSIS — N3941 Urge incontinence: Secondary | ICD-10-CM | POA: Diagnosis not present

## 2023-06-20 DIAGNOSIS — I700 Atherosclerosis of aorta: Secondary | ICD-10-CM | POA: Diagnosis not present

## 2023-06-20 DIAGNOSIS — E1122 Type 2 diabetes mellitus with diabetic chronic kidney disease: Secondary | ICD-10-CM | POA: Diagnosis not present

## 2023-06-20 DIAGNOSIS — N182 Chronic kidney disease, stage 2 (mild): Secondary | ICD-10-CM | POA: Diagnosis not present

## 2023-06-20 DIAGNOSIS — E7849 Other hyperlipidemia: Secondary | ICD-10-CM | POA: Diagnosis not present

## 2023-06-20 DIAGNOSIS — Z6831 Body mass index (BMI) 31.0-31.9, adult: Secondary | ICD-10-CM | POA: Diagnosis not present

## 2023-06-20 DIAGNOSIS — M5431 Sciatica, right side: Secondary | ICD-10-CM | POA: Diagnosis not present

## 2023-06-27 ENCOUNTER — Encounter (INDEPENDENT_AMBULATORY_CARE_PROVIDER_SITE_OTHER): Payer: Medicare HMO | Admitting: Ophthalmology

## 2023-06-27 DIAGNOSIS — I10 Essential (primary) hypertension: Secondary | ICD-10-CM

## 2023-06-27 DIAGNOSIS — H43813 Vitreous degeneration, bilateral: Secondary | ICD-10-CM

## 2023-06-27 DIAGNOSIS — H353231 Exudative age-related macular degeneration, bilateral, with active choroidal neovascularization: Secondary | ICD-10-CM

## 2023-06-27 DIAGNOSIS — H35033 Hypertensive retinopathy, bilateral: Secondary | ICD-10-CM | POA: Diagnosis not present

## 2023-08-09 ENCOUNTER — Encounter (HOSPITAL_COMMUNITY): Payer: Self-pay

## 2023-08-09 ENCOUNTER — Other Ambulatory Visit: Payer: Self-pay

## 2023-08-09 ENCOUNTER — Emergency Department (HOSPITAL_COMMUNITY)
Admission: EM | Admit: 2023-08-09 | Discharge: 2023-08-09 | Discharge status: Against Medical Advice | Attending: Emergency Medicine | Admitting: Emergency Medicine

## 2023-08-09 DIAGNOSIS — M47817 Spondylosis without myelopathy or radiculopathy, lumbosacral region: Secondary | ICD-10-CM | POA: Diagnosis not present

## 2023-08-09 DIAGNOSIS — M51372 Other intervertebral disc degeneration, lumbosacral region with discogenic back pain and lower extremity pain: Secondary | ICD-10-CM | POA: Diagnosis not present

## 2023-08-09 DIAGNOSIS — M1612 Unilateral primary osteoarthritis, left hip: Secondary | ICD-10-CM | POA: Diagnosis not present

## 2023-08-09 DIAGNOSIS — I10 Essential (primary) hypertension: Secondary | ICD-10-CM | POA: Diagnosis not present

## 2023-08-09 DIAGNOSIS — Z79899 Other long term (current) drug therapy: Secondary | ICD-10-CM | POA: Diagnosis not present

## 2023-08-09 DIAGNOSIS — M51362 Other intervertebral disc degeneration, lumbar region with discogenic back pain and lower extremity pain: Secondary | ICD-10-CM | POA: Diagnosis not present

## 2023-08-09 DIAGNOSIS — M160 Bilateral primary osteoarthritis of hip: Secondary | ICD-10-CM | POA: Diagnosis not present

## 2023-08-09 DIAGNOSIS — Z7984 Long term (current) use of oral hypoglycemic drugs: Secondary | ICD-10-CM | POA: Diagnosis not present

## 2023-08-09 DIAGNOSIS — M79605 Pain in left leg: Secondary | ICD-10-CM | POA: Diagnosis not present

## 2023-08-09 DIAGNOSIS — Z5321 Procedure and treatment not carried out due to patient leaving prior to being seen by health care provider: Secondary | ICD-10-CM | POA: Insufficient documentation

## 2023-08-09 DIAGNOSIS — M51379 Other intervertebral disc degeneration, lumbosacral region without mention of lumbar back pain or lower extremity pain: Secondary | ICD-10-CM | POA: Diagnosis not present

## 2023-08-09 DIAGNOSIS — M25552 Pain in left hip: Secondary | ICD-10-CM | POA: Diagnosis not present

## 2023-08-09 DIAGNOSIS — M25551 Pain in right hip: Secondary | ICD-10-CM | POA: Diagnosis not present

## 2023-08-09 DIAGNOSIS — Z91041 Radiographic dye allergy status: Secondary | ICD-10-CM | POA: Diagnosis not present

## 2023-08-09 DIAGNOSIS — Z880 Allergy status to penicillin: Secondary | ICD-10-CM | POA: Diagnosis not present

## 2023-08-09 DIAGNOSIS — E119 Type 2 diabetes mellitus without complications: Secondary | ICD-10-CM | POA: Diagnosis not present

## 2023-08-09 NOTE — ED Triage Notes (Signed)
 Pt arrived via POV c/o bilateral hip and leg pain. Pt reports pain is so severe she can not walk. Pt arrived with her daughter who reports the Pt called her and asked to be taken to the ER for evaluation.

## 2023-08-11 ENCOUNTER — Emergency Department (HOSPITAL_BASED_OUTPATIENT_CLINIC_OR_DEPARTMENT_OTHER)

## 2023-08-11 ENCOUNTER — Other Ambulatory Visit: Payer: Self-pay

## 2023-08-11 ENCOUNTER — Emergency Department (HOSPITAL_COMMUNITY)

## 2023-08-11 ENCOUNTER — Inpatient Hospital Stay (HOSPITAL_COMMUNITY)
Admission: EM | Admit: 2023-08-11 | Discharge: 2023-08-17 | DRG: 519 | Disposition: A | Discharge status: Home / Self Care | Attending: Neurosurgery | Admitting: Neurosurgery

## 2023-08-11 ENCOUNTER — Encounter (HOSPITAL_COMMUNITY): Payer: Self-pay

## 2023-08-11 DIAGNOSIS — R52 Pain, unspecified: Secondary | ICD-10-CM | POA: Diagnosis not present

## 2023-08-11 DIAGNOSIS — T886XXA Anaphylactic reaction due to adverse effect of correct drug or medicament properly administered, initial encounter: Secondary | ICD-10-CM | POA: Diagnosis not present

## 2023-08-11 DIAGNOSIS — Z0189 Encounter for other specified special examinations: Secondary | ICD-10-CM | POA: Diagnosis not present

## 2023-08-11 DIAGNOSIS — E119 Type 2 diabetes mellitus without complications: Secondary | ICD-10-CM | POA: Diagnosis not present

## 2023-08-11 DIAGNOSIS — E785 Hyperlipidemia, unspecified: Secondary | ICD-10-CM | POA: Diagnosis present

## 2023-08-11 DIAGNOSIS — G8929 Other chronic pain: Secondary | ICD-10-CM | POA: Diagnosis present

## 2023-08-11 DIAGNOSIS — M5126 Other intervertebral disc displacement, lumbar region: Secondary | ICD-10-CM | POA: Diagnosis not present

## 2023-08-11 DIAGNOSIS — I10 Essential (primary) hypertension: Secondary | ICD-10-CM | POA: Diagnosis present

## 2023-08-11 DIAGNOSIS — I959 Hypotension, unspecified: Secondary | ICD-10-CM | POA: Diagnosis not present

## 2023-08-11 DIAGNOSIS — Z7984 Long term (current) use of oral hypoglycemic drugs: Secondary | ICD-10-CM | POA: Diagnosis not present

## 2023-08-11 DIAGNOSIS — M5416 Radiculopathy, lumbar region: Secondary | ICD-10-CM | POA: Diagnosis present

## 2023-08-11 DIAGNOSIS — M4807 Spinal stenosis, lumbosacral region: Secondary | ICD-10-CM | POA: Diagnosis present

## 2023-08-11 DIAGNOSIS — Z91041 Radiographic dye allergy status: Secondary | ICD-10-CM

## 2023-08-11 DIAGNOSIS — R0902 Hypoxemia: Secondary | ICD-10-CM | POA: Diagnosis not present

## 2023-08-11 DIAGNOSIS — M5116 Intervertebral disc disorders with radiculopathy, lumbar region: Principal | ICD-10-CM | POA: Diagnosis present

## 2023-08-11 DIAGNOSIS — M5127 Other intervertebral disc displacement, lumbosacral region: Principal | ICD-10-CM

## 2023-08-11 DIAGNOSIS — M47817 Spondylosis without myelopathy or radiculopathy, lumbosacral region: Secondary | ICD-10-CM | POA: Diagnosis not present

## 2023-08-11 DIAGNOSIS — Y848 Other medical procedures as the cause of abnormal reaction of the patient, or of later complication, without mention of misadventure at the time of the procedure: Secondary | ICD-10-CM | POA: Diagnosis not present

## 2023-08-11 DIAGNOSIS — I472 Ventricular tachycardia, unspecified: Secondary | ICD-10-CM | POA: Diagnosis not present

## 2023-08-11 DIAGNOSIS — G43909 Migraine, unspecified, not intractable, without status migrainosus: Secondary | ICD-10-CM | POA: Diagnosis present

## 2023-08-11 DIAGNOSIS — M79605 Pain in left leg: Secondary | ICD-10-CM | POA: Diagnosis not present

## 2023-08-11 DIAGNOSIS — Z7985 Long-term (current) use of injectable non-insulin antidiabetic drugs: Secondary | ICD-10-CM | POA: Diagnosis not present

## 2023-08-11 DIAGNOSIS — M48061 Spinal stenosis, lumbar region without neurogenic claudication: Secondary | ICD-10-CM | POA: Diagnosis not present

## 2023-08-11 DIAGNOSIS — M5137 Other intervertebral disc degeneration, lumbosacral region with discogenic back pain only: Secondary | ICD-10-CM | POA: Diagnosis not present

## 2023-08-11 DIAGNOSIS — E1165 Type 2 diabetes mellitus with hyperglycemia: Secondary | ICD-10-CM | POA: Diagnosis present

## 2023-08-11 DIAGNOSIS — J9589 Other postprocedural complications and disorders of respiratory system, not elsewhere classified: Secondary | ICD-10-CM | POA: Diagnosis not present

## 2023-08-11 DIAGNOSIS — M47816 Spondylosis without myelopathy or radiculopathy, lumbar region: Secondary | ICD-10-CM | POA: Diagnosis not present

## 2023-08-11 DIAGNOSIS — E876 Hypokalemia: Secondary | ICD-10-CM | POA: Diagnosis present

## 2023-08-11 LAB — COMPREHENSIVE METABOLIC PANEL WITH GFR
ALT: 16 U/L (ref 0–44)
AST: 33 U/L (ref 15–41)
Albumin: 4.6 g/dL (ref 3.5–5.0)
Alkaline Phosphatase: 79 U/L (ref 38–126)
Anion gap: 16 — ABNORMAL HIGH (ref 5–15)
BUN: 25 mg/dL — ABNORMAL HIGH (ref 8–23)
CO2: 18 mmol/L — ABNORMAL LOW (ref 22–32)
Calcium: 9.7 mg/dL (ref 8.9–10.3)
Chloride: 104 mmol/L (ref 98–111)
Creatinine, Ser: 1.05 mg/dL — ABNORMAL HIGH (ref 0.44–1.00)
GFR, Estimated: 56 mL/min — ABNORMAL LOW (ref >60)
Glucose, Bld: 157 mg/dL — ABNORMAL HIGH (ref 70–99)
Potassium: 3.8 mmol/L (ref 3.5–5.1)
Sodium: 138 mmol/L (ref 135–145)
Total Bilirubin: 1.2 mg/dL (ref 0.0–1.2)
Total Protein: 8.4 g/dL — ABNORMAL HIGH (ref 6.5–8.1)

## 2023-08-11 LAB — CBC WITH DIFFERENTIAL/PLATELET
Abs Immature Granulocytes: 0.05 10*3/uL (ref 0.00–0.07)
Basophils Absolute: 0 10*3/uL (ref 0.0–0.1)
Basophils Relative: 0 %
Eosinophils Absolute: 0 10*3/uL (ref 0.0–0.5)
Eosinophils Relative: 0 %
HCT: 41.7 % (ref 36.0–46.0)
Hemoglobin: 13.6 g/dL (ref 12.0–15.0)
Immature Granulocytes: 1 %
Lymphocytes Relative: 25 %
Lymphs Abs: 2.3 10*3/uL (ref 0.7–4.0)
MCH: 29.3 pg (ref 26.0–34.0)
MCHC: 32.6 g/dL (ref 30.0–36.0)
MCV: 89.9 fL (ref 80.0–100.0)
Monocytes Absolute: 0.4 10*3/uL (ref 0.1–1.0)
Monocytes Relative: 4 %
Neutro Abs: 6.5 10*3/uL (ref 1.7–7.7)
Neutrophils Relative %: 70 %
Platelets: 320 10*3/uL (ref 150–400)
RBC: 4.64 MIL/uL (ref 3.87–5.11)
RDW: 13.2 % (ref 11.5–15.5)
WBC: 9.3 10*3/uL (ref 4.0–10.5)
nRBC: 0 % (ref 0.0–0.2)

## 2023-08-11 LAB — CK: Total CK: 58 U/L (ref 38–234)

## 2023-08-11 MED ORDER — SODIUM CHLORIDE 0.9 % IV BOLUS
1000.0000 mL | Freq: Once | INTRAVENOUS | Status: AC
  Administered 2023-08-11: 1000 mL via INTRAVENOUS

## 2023-08-11 MED ORDER — GADOBUTROL 1 MMOL/ML IV SOLN
9.0000 mL | Freq: Once | INTRAVENOUS | Status: AC | PRN
  Administered 2023-08-11: 9 mL via INTRAVENOUS

## 2023-08-11 MED ORDER — KETOROLAC TROMETHAMINE 15 MG/ML IJ SOLN
15.0000 mg | Freq: Once | INTRAMUSCULAR | Status: AC
  Administered 2023-08-11: 15 mg via INTRAVENOUS
  Filled 2023-08-11: qty 1

## 2023-08-11 MED ORDER — HYDROMORPHONE HCL 1 MG/ML IJ SOLN
1.0000 mg | Freq: Once | INTRAMUSCULAR | Status: AC
  Administered 2023-08-11: 1 mg via INTRAVENOUS
  Filled 2023-08-11: qty 1

## 2023-08-11 MED ORDER — INSULIN ASPART 100 UNIT/ML IJ SOLN
0.0000 [IU] | INTRAMUSCULAR | Status: DC
  Administered 2023-08-12 – 2023-08-14 (×4): 1 [IU] via SUBCUTANEOUS

## 2023-08-11 MED ORDER — LIDOCAINE 5 % EX PTCH
1.0000 | MEDICATED_PATCH | CUTANEOUS | Status: DC
  Administered 2023-08-11 – 2023-08-16 (×6): 1 via TRANSDERMAL
  Filled 2023-08-11 (×7): qty 1

## 2023-08-11 MED ORDER — ACETAMINOPHEN 650 MG RE SUPP: 650.0000 mg | Freq: Four times a day (QID) | RECTAL | Status: DC | PRN

## 2023-08-11 MED ORDER — HYDRALAZINE HCL 25 MG PO TABS: 25.0000 mg | ORAL_TABLET | Freq: Four times a day (QID) | ORAL | Status: DC | PRN

## 2023-08-11 MED ORDER — METHOCARBAMOL 500 MG PO TABS
500.0000 mg | ORAL_TABLET | Freq: Four times a day (QID) | ORAL | Status: DC | PRN
  Administered 2023-08-13: 500 mg via ORAL
  Filled 2023-08-11: qty 1

## 2023-08-11 MED ORDER — MORPHINE SULFATE (PF) 4 MG/ML IV SOLN
4.0000 mg | Freq: Once | INTRAVENOUS | Status: AC
  Administered 2023-08-11: 4 mg via INTRAVENOUS
  Filled 2023-08-11: qty 1

## 2023-08-11 MED ORDER — DIAZEPAM 5 MG/ML IJ SOLN
2.5000 mg | Freq: Once | INTRAMUSCULAR | Status: AC
  Administered 2023-08-11: 2.5 mg via INTRAVENOUS
  Filled 2023-08-11: qty 2

## 2023-08-11 MED ORDER — OXYCODONE HCL 5 MG PO TABS
5.0000 mg | ORAL_TABLET | ORAL | Status: DC | PRN
  Administered 2023-08-12 – 2023-08-17 (×10): 5 mg via ORAL
  Filled 2023-08-11 (×10): qty 1

## 2023-08-11 MED ORDER — DIAZEPAM 5 MG/ML IJ SOLN
5.0000 mg | Freq: Once | INTRAMUSCULAR | Status: AC | PRN
  Administered 2023-08-11: 5 mg via INTRAVENOUS
  Filled 2023-08-11: qty 2

## 2023-08-11 MED ORDER — LACTATED RINGERS IV SOLN: INTRAVENOUS | Status: AC

## 2023-08-11 MED ORDER — POLYETHYLENE GLYCOL 3350 17 G PO PACK
17.0000 g | PACK | Freq: Every day | ORAL | Status: AC | PRN
Start: 2023-08-11 — End: ?

## 2023-08-11 MED ORDER — PROCHLORPERAZINE EDISYLATE 10 MG/2ML IJ SOLN: 5.0000 mg | Freq: Four times a day (QID) | INTRAMUSCULAR | Status: DC | PRN

## 2023-08-11 MED ORDER — ACETAMINOPHEN 325 MG PO TABS
650.0000 mg | ORAL_TABLET | Freq: Four times a day (QID) | ORAL | Status: DC | PRN
  Administered 2023-08-12 (×2): 650 mg via ORAL
  Filled 2023-08-11 (×4): qty 2

## 2023-08-11 MED ORDER — HYDROMORPHONE HCL 1 MG/ML IJ SOLN
0.5000 mg | INTRAMUSCULAR | Status: DC | PRN
  Administered 2023-08-12 – 2023-08-13 (×5): 0.5 mg via INTRAVENOUS
  Administered 2023-08-13: .5 mg via INTRAVENOUS
  Administered 2023-08-13 – 2023-08-17 (×5): 0.5 mg via INTRAVENOUS
  Filled 2023-08-11 (×2): qty 0.5
  Filled 2023-08-11 (×3): qty 1
  Filled 2023-08-11 (×5): qty 0.5

## 2023-08-11 MED ORDER — BISACODYL 5 MG PO TBEC: 5.0000 mg | DELAYED_RELEASE_TABLET | Freq: Every day | ORAL | Status: DC | PRN

## 2023-08-11 NOTE — H&P (Signed)
 History and Physical    Priscilla Adams NWG:[id number] DOB: [birth year] DOA: 08/11/2023  PCP: Veda Gerald, MD   Patient coming from: Home   Chief Complaint: Severe pain   HPI: Priscilla Adams is a 73 y.o. female with medical history significant for type 2 diabetes mellitus and hypertension who presents with severe pain from her left hip down through the left knee.  Patient reports waking the morning of 08/09/2023 with severe pain involving her left leg.  There was no preceding trauma or inciting event that she can identify.  She describes severe pain at the left hip with radiation down the lateral and anterior aspect of the leg to the knee.  She was seen at another facility where she reports that x-rays were negative.  She has been using Lidoderm  but her symptoms have progressively worsened to the point where she is unable to ambulate or even get out of bed now.  Her chronic back pain has not changed much recently and she denies fever, chills, incontinence, or saddle anesthesia.  ED Course: Upon arrival to the ED, patient is found to be afebrile and saturating well on room air with normal HR and elevated BP.  Labs are most notable for creatinine 1.05, normal WBC, and normal CK.  Doppler study is negative for LLE DVT.  MRI reveals bilateral L4-L5 lateral recess stenosis, moderate right and severe left L5-S1 neuroforaminal stenosis due to disc bulge, endplate spurring, and severe facet arthrosis.  Patient was treated in the ED with a liter of NS, Lidoderm  patch, morphine , Dilaudid , Toradol , and 2 doses of Valium.  Neurosurgery (Dr. Larrie Po) was consulted by the ED physician.  Review of Systems:  All other systems reviewed and apart from HPI, are negative.  Past Medical History:  Diagnosis Date   Diabetes mellitus without complication (HCC)    Hypertension     Past Surgical History:  Procedure Laterality Date   appendectomy     CHOLECYSTECTOMY     FEMUR SURGERY Left    PARTIAL  HYSTERECTOMY      Social History:   reports that she has never smoked. She has never used smokeless tobacco. She reports that she does not currently use alcohol. She reports that she does not currently use drugs.  Allergies  Allergen Reactions   Contrast Media [Iodinated Contrast Media] Shortness Of Breath    Patient reports she has had a reaction x40 years ago   Penicillins     History reviewed. No pertinent family history.   Prior to Admission medications   Medication Sig Start Date End Date Taking? Authorizing Provider  acarbose (PRECOSE) 100 MG tablet Take 100 mg by mouth 3 (three) times daily. 07/26/20   [provider]  ACCU-CHEK AVIVA PLUS test strip  05/27/19   [provider]  acetaminophen -codeine (TYLENOL  #3) 300-30 MG tablet  12/30/18   [provider]  atorvastatin (LIPITOR) 40 MG tablet  11/08/18   [provider]  cephALEXin  (KEFLEX ) 500 MG capsule Take 1 capsule (500 mg total) by mouth 3 (three) times daily. 08/15/22   Dalene Duck, MD  cyclobenzaprine  (FLEXERIL ) 10 MG tablet Take 1 tablet (10 mg total) by mouth 2 (two) times daily as needed for muscle spasms. 08/09/20   Camellia Caves, MD  glipiZIDE (GLUCOTROL) 10 MG tablet  12/25/18   [provider]  ibuprofen (ADVIL) 800 MG tablet  01/08/19   [provider]  lisinopril (ZESTRIL) 10 MG tablet  12/09/18   [provider]  metFORMIN (GLUCOPHAGE) 1000 MG tablet  11/08/18   [provider]  naproxen  (NAPROSYN ) 500 MG tablet Take 1 tablet (500 mg total) by mouth 2 (two) times daily as needed for moderate pain. 08/09/20   Camellia Caves, MD  ondansetron  (ZOFRAN ) 4 MG tablet  05/14/19   [provider]  ondansetron  (ZOFRAN -ODT) 4 MG disintegrating tablet 4mg  ODT q4 hours prn nausea/vomit 08/15/22   Dalene Duck, MD    Physical Exam: Vitals:   08/11/23 1226 08/11/23 1733  BP: (!) 157/80 (!) 168/79  Pulse: 86 72  Resp: 16 18  Temp:  (!) 97.4 F (36.3 C)   SpO2: 98% 100%    Constitutional: NAD, calm  Eyes: PERTLA, lids and conjunctivae normal ENMT: Mucous membranes are moist. Posterior pharynx clear of any exudate or lesions.   Neck: supple, no masses  Respiratory: no wheezing, no crackles. No accessory muscle use.  Cardiovascular: S1 & S2 heard, regular rate and rhythm. No extremity edema.   Abdomen: No tenderness, soft. Bowel sounds active.  Musculoskeletal: no clubbing / cyanosis. No joint deformity upper and lower extremities.   Skin: no significant rashes, lesions, ulcers. Warm, dry, well-perfused. Neurologic: CN 2-12 grossly intact. Sensation to light touch intact. Strength 5/5 in all 4 limbs. Alert and oriented.  Psychiatric: Pleasant. Cooperative.    Labs and Imaging on Admission: I have personally reviewed following labs and imaging studies  CBC: Recent Labs  Lab 08/11/23 1614  WBC 9.3  NEUTROABS 6.5  HGB 13.6  HCT 41.7  MCV 89.9  PLT 320   Basic Metabolic Panel: Recent Labs  Lab 08/11/23 1614  NA 138  K 3.8  CL 104  CO2 18*  GLUCOSE 157*  BUN 25*  CREATININE 1.05*  CALCIUM 9.7   GFR: Estimated Creatinine Clearance: 54.3 mL/min (A) (by C-G formula based on SCr of 1.05 mg/dL (H)). Liver Function Tests: Recent Labs  Lab 08/11/23 1614  AST 33  ALT 16  ALKPHOS 79  BILITOT 1.2  PROT 8.4*  ALBUMIN 4.6   No results for input(s): "LIPASE", "AMYLASE" in the last 168 hours. No results for input(s): "AMMONIA" in the last 168 hours. Coagulation Profile: No results for input(s): "INR", "PROTIME" in the last 168 hours. Cardiac Enzymes: Recent Labs  Lab 08/11/23 1614  CKTOTAL 58   BNP (last 3 results) No results for input(s): "PROBNP" in the last 8760 hours. HbA1C: No results for input(s): "HGBA1C" in the last 72 hours. CBG: No results for input(s): "GLUCAP" in the last 168 hours. Lipid Profile: No results for input(s): "CHOL", "HDL", "LDLCALC", "TRIG", "CHOLHDL", "LDLDIRECT" in  the last 72 hours. Thyroid Function Tests: No results for input(s): "TSH", "T4TOTAL", "FREET4", "T3FREE", "THYROIDAB" in the last 72 hours. Anemia Panel: No results for input(s): "VITAMINB12", "FOLATE", "FERRITIN", "TIBC", "IRON", "RETICCTPCT" in the last 72 hours. Urine analysis:    Component Value Date/Time   COLORURINE YELLOW 08/15/2022 1444   APPEARANCEUR HAZY (A) 08/15/2022 1444   LABSPEC 1.016 08/15/2022 1444   PHURINE 6.0 08/15/2022 1444   GLUCOSEU NEGATIVE 08/15/2022 1444   HGBUR SMALL (A) 08/15/2022 1444   BILIRUBINUR NEGATIVE 08/15/2022 1444   KETONESUR 40 (A) 08/15/2022 1444   PROTEINUR 30 (A) 08/15/2022 1444   NITRITE POSITIVE (A) 08/15/2022 1444   LEUKOCYTESUR LARGE (A) 08/15/2022 1444   Sepsis Labs: @LABRCNTIP (procalcitonin:4,lacticidven:4) )No results found for this or any previous visit (from the past 240 hours).   Radiological Exams on Admission: MR Lumbar Spine W Wo Contrast  Result Date: 08/11/2023 CLINICAL DATA:  Low back pain with difficulty walking EXAM: MRI LUMBAR SPINE WITHOUT AND WITH CONTRAST TECHNIQUE: Multiplanar and multiecho pulse sequences of the lumbar spine were obtained without and with intravenous contrast. CONTRAST:  9mL GADAVIST GADOBUTROL 1 MMOL/ML IV SOLN COMPARISON:  None Available. FINDINGS: Segmentation:  Standard. Alignment:  Physiologic. Vertebrae:  No fracture, evidence of discitis, or bone lesion. Conus medullaris and cauda equina: Conus extends to the L1 level. Conus and cauda equina appear normal. Paraspinal and other soft tissues: Negative. Disc levels: L1-L2: Small central disc protrusion and mild facet hypertrophy. No spinal canal stenosis. No neural foraminal stenosis. L2-L3: Normal disc space and facet joints. No spinal canal stenosis. No neural foraminal stenosis. L3-L4: Normal disc space and facet joints. No spinal canal stenosis. No neural foraminal stenosis. L4-L5: Small disc bulge with moderate facet hypertrophy. Narrowing of both  lateral recesses without central spinal canal stenosis. No neural foraminal stenosis. L5-S1: Small disc bulge with endplate spurring and severe facet hypertrophy. No spinal canal stenosis. Moderate right and severe left neural foraminal stenosis. Visualized sacrum: Normal. IMPRESSION: 1. Moderate right and severe left L5-S1 neural foraminal stenosis due to combination of disc bulge, endplate spurring and severe facet arthrosis. 2. Bilateral lateral recess stenosis at L4-L5, which may serve as a source of L5 radiculopathy. Electronically Signed   By: Juanetta Nordmann M.D.   On: 08/11/2023 20:44   CT Lumbar Spine Wo Contrast Result Date: 08/11/2023 EXAM: CT OF THE LUMBAR SPINE WITHOUT CONTRAST 08/11/2023 06:05:20 PM TECHNIQUE: CT of the lumbar spine was performed without the administration of intravenous contrast. Multiplanar reformatted images are provided for review. Automated exposure control, iterative reconstruction, and/or weight based adjustment of the mA/kV was utilized to reduce the radiation dose to as low as reasonably achievable. COMPARISON: None available. CLINICAL HISTORY: Low back pain, increased fracture risk. Was seen at [facility] on 08/09/23 and provided gabapentin, lidocaine  patch. 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 FINDINGS: BONES AND ALIGNMENT: Straightening of the normal lumbar lordosis is present. No significant listhesis is present. Mild leftward curvature is present in the mid lumbar spine. Degenerative changes are present at the SI joints bilaterally. DEGENERATIVE CHANGES: T12-L1: Asymmetric left-sided facet hypertrophy results in mild left foraminal stenosis. A broad-based disc protrusion and moderate facet hypertrophy are present bilaterally at L1-2. Mild central canal stenosis is present. Moderate right and mild left foraminal narrowing is present. L2-3: Rightward disc bulge is present. Facet hypertrophy is  worse on the right. No focal stenosis is present. L3-4: A broad-based disc protrusion is present. Facet hypertrophy is worse on the right. Mild right subarticular narrowing is present. A leftward disc protrusion extends into the foramen with moderate left foraminal stenosis. L4-5: A broad-based disc protrusion is present. Moderate facet hypertrophy is noted bilaterally. This results in moderate-to-severe central canal stenosis. Moderate foraminal narrowing is worse left than right. L5-S1: Advanced facet hypertrophy is present. Endplate spurs are present. Moderate-to-severe subarticular stenosis is present bilaterally. Severe left and moderate right foraminal stenosis is present. SOFT TISSUES: No paraspinal hematoma. LIMITED RETROPERITONEUM: Limited images of the retroperitoneum demonstrate no acute abnormality. Atherosclerotic changes are present in the aorta without aneurysm. Visualized abdomen is otherwise unremarkable. IMPRESSION: 1. Severe degenerative changes at L5-S1 with advanced facet hypertrophy, moderate-to-severe subarticular stenosis bilaterally, and severe left/moderate right foraminal stenosis. 2. Moderate-to-severe central canal stenosis at L4-5 with moderate facet hypertrophy and moderate foraminal narrowing, worse  on the left. 3. Left foraminal stenosis at L3-4. 4. Mild central canal stenosis at L1-2 with moderate facet hypertrophy and moderate right/mild left foraminal narrowing. Electronically signed by: Audree Leas MD 08/11/2023 06:21 PM EDT RP Workstation: [id number]   CT FEMUR LEFT WO CONTRAST Result Date: 08/11/2023 CLINICAL DATA:  Osteomyelitis suspected, femur, xray done L femur rod and now has L leg swelling and pain r/o fracture vs hardware migration EXAM: CT OF THE LOWER LEFT EXTREMITY WITHOUT CONTRAST TECHNIQUE: Multidetector CT imaging of the lower left extremity was performed according to the standard protocol. RADIATION DOSE REDUCTION: This exam was performed according to  the departmental dose-optimization program which includes automated exposure control, adjustment of the mA and/or kV according to patient size and/or use of iterative reconstruction technique. COMPARISON:  None Available. FINDINGS: Bones/Joint/Cartilage Left intramedullary rod in place in the left femur across a healed left femoral midshaft fracture. Without prior imaging, is difficult to exclude migration, but no visible obvious hardware complicating feature. No bone destruction to suggest osteomyelitis. Ligaments Suboptimally assessed by CT. Muscles and Tendons Negative Soft tissues Negative IMPRESSION: Left femoral intramedullary rod in place across a healed mid left femoral fracture. No obvious hardware complicating feature. No acute bony abnormality. Electronically Signed   By: Janeece Mechanic M.D.   On: 08/11/2023 18:13   VAS US  LOWER EXTREMITY VENOUS (DVT) (ONLY [facility] & [facility]) Result Date: 08/11/2023  Lower Venous DVT Study Patient Name:  Priscilla Adams  Date of Exam:   08/11/2023 Medical Rec #: [id number]       Accession #:    [id number] Date of Birth: 1950-07-10       Patient Gender: F Patient Age:   26 years Exam Location:  [facility] Procedure:      VAS US  LOWER EXTREMITY VENOUS (DVT) Referring Phys: Paris Bolds --------------------------------------------------------------------------------  Indications: Pain.  Comparison Study: Prior negative left LEV done 02/07/19 at [facility] Performing Technologist: Carleene Chase RVS  Examination Guidelines: A complete evaluation includes B-mode imaging, spectral Doppler, color Doppler, and power Doppler as needed of all accessible portions of each vessel. Bilateral testing is considered an integral part of a complete examination. Limited examinations for reoccurring indications may be performed as noted. The reflux portion of the exam is performed with the patient in reverse Trendelenburg.  +-----+---------------+---------+-----------+----------+--------------+  RIGHTCompressibilityPhasicitySpontaneityPropertiesThrombus Aging +-----+---------------+---------+-----------+----------+--------------+ CFV  Full           Yes      Yes                                 +-----+---------------+---------+-----------+----------+--------------+ SFJ  Full                                                        +-----+---------------+---------+-----------+----------+--------------+   +---------+---------------+---------+-----------+----------+--------------+ LEFT     CompressibilityPhasicitySpontaneityPropertiesThrombus Aging +---------+---------------+---------+-----------+----------+--------------+ CFV      Full           Yes      Yes                                 +---------+---------------+---------+-----------+----------+--------------+ SFJ      Full                                                        +---------+---------------+---------+-----------+----------+--------------+  FV Prox  Full                                                        +---------+---------------+---------+-----------+----------+--------------+ FV Mid   Full                                                        +---------+---------------+---------+-----------+----------+--------------+ FV DistalFull                                                        +---------+---------------+---------+-----------+----------+--------------+ PFV      Full                                                        +---------+---------------+---------+-----------+----------+--------------+ POP      Full           Yes      Yes                                 +---------+---------------+---------+-----------+----------+--------------+ PTV      Full                                                        +---------+---------------+---------+-----------+----------+--------------+ PERO     Full                                                         +---------+---------------+---------+-----------+----------+--------------+     Summary: RIGHT: - No evidence of common femoral vein obstruction.   LEFT: - There is no evidence of deep vein thrombosis in the lower extremity.  - A cystic structure is found in the popliteal fossa measuring 1.05 cm x 2.25 cm.  *See table(s) above for measurements and observations.    Preliminary     Assessment/Plan   1. Lumbar radiculopathy; intractable pain  - Continue pain-control, follow-up on neurosurgery recommendations    2. Type II DM  - Hold oral agents, check CBGs, and use low-intensity SSI for now    3. Hypertension  - She does not remember what antihypertensive she takes at home, will treat as-needed only for now     DVT prophylaxis: SCDs Code Status: Full  Level of Care: Level of care: Med-Surg Family Communication: None present  Disposition Plan:  Patient is from: Home  Anticipated d/c is to: TBD Anticipated d/c date is: Possibly as early as 6/8 or 6/9  Patient currently: Pending neurosurgery consultation,  pain-control  Consults called: Neurosurgery  Admission status: Observation     Walton Guppy, MD [facility]  08/11/2023, 9:35 PM
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
## 2023-08-11 NOTE — ED Triage Notes (Signed)
 Pt arrived POV stating she hurts all over. Pt states the pain starts on her right shoulder goes all the way across then down her back and down her leg and her knees are hurting.

## 2023-08-11 NOTE — ED Provider Triage Note (Addendum)
 Emergency Medicine Provider Triage Evaluation Note  Priscilla Adams , a 73 y.o. female  was evaluated in triage.  Pt complains of LLE since Thursday morning. Radiates into left lower hip  Has only tried tylenol  for pain. No IVDU, malignancy, saddle paresthesia, fevers, paresthesia  Steel rod in 1980. No recent falls, hx of clot, recent immobilization, nor sx  Was seen at [facility] on 08/09/23 and provided gabapentin, lidocaine  patch. XR of lumbar spine and left hip showed moderate bilateral hip OA and severe DDD, facet arthrosis  Review of Systems  Positive: See hpi Negative:   Physical Exam  BP (!) 157/80 (BP Location: Left Arm)   Pulse 86   Temp (!) 97.4 F (36.3 C)   Resp 16   SpO2 98%  Gen:   Awake. Very uncomfortable and moaning in pain Resp:  Normal effort  MSK:   Moves extremities without difficulty  Other:  No significant swelling nor erythema nor infectious signs of LLE. No pedal edema bilaterally  Medical Decision Making  Medically screening exam initiated at 1:11 PM.  Appropriate orders placed.  Priscilla Adams was informed that the remainder of the evaluation will be completed by another provider, this initial triage assessment does not replace that evaluation, and the importance of remaining in the ED until their evaluation is complete.  Will order DVT US  to r/o clot as etiology of clot and provide toradol , lido patch for pain.   Royann Cords, PA 08/11/23 1319    Royann Cords, PA 08/11/23 1320

## 2023-08-11 NOTE — Progress Notes (Signed)
 VASCULAR LAB    Left lower extremity venous duplex has been performed.  See CV proc for preliminary results.  Relayed results to Dr. Delana Favors via secure chat  Carleene Chase, RVT 08/11/2023, 5:14 PM

## 2023-08-12 DIAGNOSIS — R52 Pain, unspecified: Secondary | ICD-10-CM | POA: Diagnosis not present

## 2023-08-12 LAB — BASIC METABOLIC PANEL WITH GFR
Anion gap: 13 (ref 5–15)
BUN: 28 mg/dL — ABNORMAL HIGH (ref 8–23)
CO2: 19 mmol/L — ABNORMAL LOW (ref 22–32)
Calcium: 8.9 mg/dL (ref 8.9–10.3)
Chloride: 106 mmol/L (ref 98–111)
Creatinine, Ser: 0.94 mg/dL (ref 0.44–1.00)
GFR, Estimated: >60 mL/min (ref >60)
Glucose, Bld: 163 mg/dL — ABNORMAL HIGH (ref 70–99)
Potassium: 3.4 mmol/L — ABNORMAL LOW (ref 3.5–5.1)
Sodium: 138 mmol/L (ref 135–145)

## 2023-08-12 LAB — CBC
HCT: 37.3 % (ref 36.0–46.0)
Hemoglobin: 12.4 g/dL (ref 12.0–15.0)
MCH: 29.7 pg (ref 26.0–34.0)
MCHC: 33.2 g/dL (ref 30.0–36.0)
MCV: 89.4 fL (ref 80.0–100.0)
Platelets: 306 10*3/uL (ref 150–400)
RBC: 4.17 MIL/uL (ref 3.87–5.11)
RDW: 13.3 % (ref 11.5–15.5)
WBC: 9.2 10*3/uL (ref 4.0–10.5)
nRBC: 0 % (ref 0.0–0.2)

## 2023-08-12 LAB — GLUCOSE, CAPILLARY
Glucose-Capillary: 125 mg/dL — ABNORMAL HIGH (ref 70–99)
Glucose-Capillary: 133 mg/dL — ABNORMAL HIGH (ref 70–99)
Glucose-Capillary: 134 mg/dL — ABNORMAL HIGH (ref 70–99)
Glucose-Capillary: 135 mg/dL — ABNORMAL HIGH (ref 70–99)
Glucose-Capillary: 156 mg/dL — ABNORMAL HIGH (ref 70–99)
Glucose-Capillary: 99 mg/dL (ref 70–99)

## 2023-08-12 LAB — CBG MONITORING, ED: Glucose-Capillary: 164 mg/dL — ABNORMAL HIGH (ref 70–99)

## 2023-08-12 MED ORDER — POLYMYXIN B-TRIMETHOPRIM 10000-0.1 UNIT/ML-% OP SOLN
1.0000 [drp] | Freq: Four times a day (QID) | OPHTHALMIC | Status: DC
  Administered 2023-08-12 – 2023-08-14 (×6): 1 [drp] via OPHTHALMIC
  Filled 2023-08-12 (×3): qty 10

## 2023-08-12 MED ORDER — POTASSIUM CHLORIDE CRYS ER 20 MEQ PO TBCR
40.0000 meq | EXTENDED_RELEASE_TABLET | ORAL | Status: AC
  Administered 2023-08-12 (×2): 40 meq via ORAL
  Filled 2023-08-12 (×2): qty 2

## 2023-08-12 MED ORDER — ATORVASTATIN CALCIUM 40 MG PO TABS
40.0000 mg | ORAL_TABLET | Freq: Every day | ORAL | Status: DC
  Administered 2023-08-12 – 2023-08-17 (×4): 40 mg via ORAL
  Filled 2023-08-12 (×4): qty 1

## 2023-08-12 MED ORDER — ACETAMINOPHEN-CODEINE #3 300-30 MG PO TABS
1.0000 | ORAL_TABLET | Freq: Every day | ORAL | Status: DC | PRN
  Filled 2023-08-12: qty 1

## 2023-08-12 MED ORDER — IRBESARTAN 150 MG PO TABS
150.0000 mg | ORAL_TABLET | Freq: Every day | ORAL | Status: DC
  Administered 2023-08-12: 150 mg via ORAL
  Filled 2023-08-12: qty 1

## 2023-08-12 MED ORDER — GABAPENTIN 100 MG PO CAPS
100.0000 mg | ORAL_CAPSULE | Freq: Three times a day (TID) | ORAL | Status: DC
  Administered 2023-08-12 – 2023-08-17 (×12): 100 mg via ORAL
  Filled 2023-08-12 (×12): qty 1

## 2023-08-12 NOTE — Care Management Obs Status (Signed)
 MEDICARE OBSERVATION STATUS NOTIFICATION   Patient Details  Name: STAYCE DELANCY MRN: [id number] Date of Birth: Nov 06, 1950   Medicare Observation Status Notification Given:  Yes    Ronni Colace, RN 08/12/2023, 9:42 AM

## 2023-08-12 NOTE — ED Notes (Addendum)
 Pt ambulated 10 ft to and from the bathroom. Little assistance required.

## 2023-08-12 NOTE — Care Management (Signed)
 Transition of Care [facility]) - Inpatient Brief Assessment   Patient Details  Name: Priscilla Adams MRN: [id number] Date of Birth: [birth year]  [facility] CM/SW Contact:    Ronni Colace, RN Phone Number: 08/12/2023, 9:48 AM   Clinical Narrative:  Patient presented with pain difficulty walking. She was seen by Neurosurgery and elects medical management at this time. She lives alone, however her daughter lives on the property and is available to help her. Discussed possibility of home health She states she will talk to her daughter about it. PT consult placed. She does have a walker, but would like a rolator with seat.  Transition of Care Asessment: Insurance and Status: Insurance coverage has been reviewed Patient has primary care physician: Yes Home environment has been reviewed: lives alone but daughter lives on property Prior level of function:: Independent with use of walker Prior/Current Home Services: No current home services Social Drivers of Health Review: SDOH reviewed no interventions necessary Readmission risk has been reviewed: Yes Transition of care needs: transition of care needs identified, TOC will continue to follow

## 2023-08-12 NOTE — Progress Notes (Signed)
 PROGRESS NOTE    EVANNE MATSUNAGA  WGN:[id number] DOB: 1950-05-18 DOA: 08/11/2023 PCP: Veda Gerald, MD   Brief Narrative:  HPI: Priscilla Adams is a 73 y.o. female with medical history significant for type 2 diabetes mellitus and hypertension who presents with severe pain from her left hip down through the left knee.   Patient reports waking the morning of 08/09/2023 with severe pain involving her left leg.  There was no preceding trauma or inciting event that she can identify.  She describes severe pain at the left hip with radiation down the lateral and anterior aspect of the leg to the knee.   She was seen at another facility where she reports that x-rays were negative.  She has been using Lidoderm  but her symptoms have progressively worsened to the point where she is unable to ambulate or even get out of bed now.   Her chronic back pain has not changed much recently and she denies fever, chills, incontinence, or saddle anesthesia.   ED Course: Upon arrival to the ED, patient is found to be afebrile and saturating well on room air with normal HR and elevated BP.  Labs are most notable for creatinine 1.05, normal WBC, and normal CK.  Doppler study is negative for LLE DVT.  MRI reveals bilateral L4-L5 lateral recess stenosis, moderate right and severe left L5-S1 neuroforaminal stenosis due to disc bulge, endplate spurring, and severe facet arthrosis.   Patient was treated in the ED with a liter of NS, Lidoderm  patch, morphine , Dilaudid , Toradol , and 2 doses of Valium.  Neurosurgery (Dr. Larrie Po) was consulted by the ED physician.  Assessment & Plan:   Principal Problem:   Intractable pain Active Problems:   Diabetes mellitus without complication (HCC)   Hypertension  Acute on chronic low back pain due to lumbar radiculopathy: MRI with lumbar radiculopathy.  Neurosurgery to see.  Continue current pain management.  Resume gabapentin.  PT OT.  Type 2 diabetes mellitus: Hold oral  antidiabetic agents.  Continue SSI.  Essential hypertension: Blood pressure slightly elevated.  Will resume irbesartan.  Continue as needed hydralazine.  Hypokalemia: Replenished.  Hyperlipidemia: Resume atorvastatin.  DVT prophylaxis: SCDs Start: 08/11/23 2134   Code Status: Full Code  Family Communication:  None present at bedside.  Plan of care discussed with patient in length and he/she verbalized understanding and agreed with it.  Status is: Observation The patient will require care spanning > 2 midnights and should be moved to inpatient because: Needs evaluation by neurosurgery as well as PT OT.   Estimated body mass index is 31.32 kg/m as calculated from the following:   Height as of 08/09/23: 5\' 7"  (1.702 m).   Weight as of 08/09/23: 90.7 kg.    Nutritional Assessment: There is no height or weight on file to calculate BMI.. Seen by dietician.  I agree with the assessment and plan as outlined below: Nutrition Status:        . Skin Assessment: I have examined the patient's skin and I agree with the wound assessment as performed by the wound care RN as outlined below:    Consultants:  Neurosurgery  Procedures:  As above  Antimicrobials:  Anti-infectives (From admission, onward)    None         Subjective: Patient seen and examined, still complains of left lower extremity pain, left lower extremity pain was worse than the lower back pain.  She had no other complaint.  She is fully alert and oriented.  Objective: Vitals:   08/11/23 1226 08/11/23 1733 08/12/23 0519  BP: (!) 157/80 (!) 168/79 (!) 145/68  Pulse: 86 72 80  Resp: 16 18 20   Temp: (!) 97.4 F (36.3 C)  98 F (36.7 C)  TempSrc:   Oral  SpO2: 98% 100% 100%   No intake or output data in the 24 hours ending 08/12/23 0755 There were no vitals filed for this visit.  Examination:  General exam: Appears calm and comfortable  Respiratory system: Clear to auscultation. Respiratory effort  normal. Cardiovascular system: S1 & S2 heard, RRR. No JVD, murmurs, rubs, gallops or clicks. No pedal edema. Gastrointestinal system: Abdomen is nondistended, soft and nontender. No organomegaly or masses felt. Normal bowel sounds heard. Central nervous system: Alert and oriented. No focal neurological deficits. Extremities: Symmetric 5 x 5 power.  Positive straight leg raise test on the left side. Skin: No rashes, lesions or ulcers Psychiatry: Judgement and insight appear normal. Mood & affect appropriate.    Data Reviewed: I have personally reviewed following labs and imaging studies  CBC: Recent Labs  Lab 08/11/23 1614 08/12/23 0528  WBC 9.3 9.2  NEUTROABS 6.5  --   HGB 13.6 12.4  HCT 41.7 37.3  MCV 89.9 89.4  PLT 320 306   Basic Metabolic Panel: Recent Labs  Lab 08/11/23 1614 08/12/23 0528  NA 138 138  K 3.8 3.4*  CL 104 106  CO2 18* 19*  GLUCOSE 157* 163*  BUN 25* 28*  CREATININE 1.05* 0.94  CALCIUM 9.7 8.9   GFR: Estimated Creatinine Clearance: 60.7 mL/min (by C-G formula based on SCr of 0.94 mg/dL). Liver Function Tests: Recent Labs  Lab 08/11/23 1614  AST 33  ALT 16  ALKPHOS 79  BILITOT 1.2  PROT 8.4*  ALBUMIN 4.6   No results for input(s): "LIPASE", "AMYLASE" in the last 168 hours. No results for input(s): "AMMONIA" in the last 168 hours. Coagulation Profile: No results for input(s): "INR", "PROTIME" in the last 168 hours. Cardiac Enzymes: Recent Labs  Lab 08/11/23 1614  CKTOTAL 58   BNP (last 3 results) No results for input(s): "PROBNP" in the last 8760 hours. HbA1C: No results for input(s): "HGBA1C" in the last 72 hours. CBG: Recent Labs  Lab 08/12/23 0515  GLUCAP 164*   Lipid Profile: No results for input(s): "CHOL", "HDL", "LDLCALC", "TRIG", "CHOLHDL", "LDLDIRECT" in the last 72 hours. Thyroid Function Tests: No results for input(s): "TSH", "T4TOTAL", "FREET4", "T3FREE", "THYROIDAB" in the last 72 hours. Anemia Panel: No results  for input(s): "VITAMINB12", "FOLATE", "FERRITIN", "TIBC", "IRON", "RETICCTPCT" in the last 72 hours. Sepsis Labs: No results for input(s): "PROCALCITON", "LATICACIDVEN" in the last 168 hours.  No results found for this or any previous visit (from the past 240 hours).   Radiology Studies: VAS US  LOWER EXTREMITY VENOUS (DVT) (ONLY [facility] & [facility]) Result Date: 08/12/2023  Lower Venous DVT Study Patient Name:  VANNESSA GODOWN  Date of Exam:   08/11/2023 Medical Rec #: [id number]       Accession #:    [id number] Date of Birth: [birth year]       Patient Gender: F Patient Age:   43 years Exam Location:  [facility] Procedure:      VAS US  LOWER EXTREMITY VENOUS (DVT) Referring Phys: Paris Bolds --------------------------------------------------------------------------------  Indications: Pain.  Comparison Study: Prior negative left LEV done 02/07/19 at [facility] Performing Technologist: Carleene Chase RVS  Examination Guidelines: A complete evaluation includes B-mode imaging, spectral Doppler, color Doppler, and power  Doppler as needed of all accessible portions of each vessel. Bilateral testing is considered an integral part of a complete examination. Limited examinations for reoccurring indications may be performed as noted. The reflux portion of the exam is performed with the patient in reverse Trendelenburg.  +-----+---------------+---------+-----------+----------+--------------+ RIGHTCompressibilityPhasicitySpontaneityPropertiesThrombus Aging +-----+---------------+---------+-----------+----------+--------------+ CFV  Full           Yes      Yes                                 +-----+---------------+---------+-----------+----------+--------------+ SFJ  Full                                                        +-----+---------------+---------+-----------+----------+--------------+   +---------+---------------+---------+-----------+----------+--------------+ LEFT      CompressibilityPhasicitySpontaneityPropertiesThrombus Aging +---------+---------------+---------+-----------+----------+--------------+ CFV      Full           Yes      Yes                                 +---------+---------------+---------+-----------+----------+--------------+ SFJ      Full                                                        +---------+---------------+---------+-----------+----------+--------------+ FV Prox  Full                                                        +---------+---------------+---------+-----------+----------+--------------+ FV Mid   Full                                                        +---------+---------------+---------+-----------+----------+--------------+ FV DistalFull                                                        +---------+---------------+---------+-----------+----------+--------------+ PFV      Full                                                        +---------+---------------+---------+-----------+----------+--------------+ POP      Full           Yes      Yes                                 +---------+---------------+---------+-----------+----------+--------------+ PTV      Full                                                        +---------+---------------+---------+-----------+----------+--------------+  PERO     Full                                                        +---------+---------------+---------+-----------+----------+--------------+     Summary: RIGHT: - No evidence of common femoral vein obstruction.   LEFT: - There is no evidence of deep vein thrombosis in the lower extremity.  - A cystic structure is found in the popliteal fossa measuring 1.05 cm x 2.25 cm.  *See table(s) above for measurements and observations. Electronically signed by Delaney Fearing on 08/12/2023 at 7:39:20 AM.    Final    MR Lumbar Spine W Wo Contrast Result Date: 08/11/2023 CLINICAL DATA:   Low back pain with difficulty walking EXAM: MRI LUMBAR SPINE WITHOUT AND WITH CONTRAST TECHNIQUE: Multiplanar and multiecho pulse sequences of the lumbar spine were obtained without and with intravenous contrast. CONTRAST:  9mL GADAVIST GADOBUTROL 1 MMOL/ML IV SOLN COMPARISON:  None Available. FINDINGS: Segmentation:  Standard. Alignment:  Physiologic. Vertebrae:  No fracture, evidence of discitis, or bone lesion. Conus medullaris and cauda equina: Conus extends to the L1 level. Conus and cauda equina appear normal. Paraspinal and other soft tissues: Negative. Disc levels: L1-L2: Small central disc protrusion and mild facet hypertrophy. No spinal canal stenosis. No neural foraminal stenosis. L2-L3: Normal disc space and facet joints. No spinal canal stenosis. No neural foraminal stenosis. L3-L4: Normal disc space and facet joints. No spinal canal stenosis. No neural foraminal stenosis. L4-L5: Small disc bulge with moderate facet hypertrophy. Narrowing of both lateral recesses without central spinal canal stenosis. No neural foraminal stenosis. L5-S1: Small disc bulge with endplate spurring and severe facet hypertrophy. No spinal canal stenosis. Moderate right and severe left neural foraminal stenosis. Visualized sacrum: Normal. IMPRESSION: 1. Moderate right and severe left L5-S1 neural foraminal stenosis due to combination of disc bulge, endplate spurring and severe facet arthrosis. 2. Bilateral lateral recess stenosis at L4-L5, which may serve as a source of L5 radiculopathy. Electronically Signed   By: Juanetta Nordmann M.D.   On: 08/11/2023 20:44   CT Lumbar Spine Wo Contrast Result Date: 08/11/2023 EXAM: CT OF THE LUMBAR SPINE WITHOUT CONTRAST 08/11/2023 06:05:20 PM TECHNIQUE: CT of the lumbar spine was performed without the administration of intravenous contrast. Multiplanar reformatted images are provided for review. Automated exposure control, iterative reconstruction, and/or weight based adjustment of the  mA/kV was utilized to reduce the radiation dose to as low as reasonably achievable. COMPARISON: None available. CLINICAL HISTORY: Low back pain, increased fracture risk. Was seen at [facility] on 08/09/23 and provided gabapentin, lidocaine  patch. 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 FINDINGS: BONES AND ALIGNMENT: Straightening of the normal lumbar lordosis is present. No significant listhesis is present. Mild leftward curvature is present in the mid lumbar spine. Degenerative changes are present at the SI joints bilaterally. DEGENERATIVE CHANGES: T12-L1: Asymmetric left-sided facet hypertrophy results in mild left foraminal stenosis. A broad-based disc protrusion and moderate facet hypertrophy are present bilaterally at L1-2. Mild central canal stenosis is present. Moderate right and mild left foraminal narrowing is present. L2-3: Rightward disc bulge is present. Facet hypertrophy is worse on the right. No focal stenosis is present. L3-4: A broad-based disc protrusion is present. Facet hypertrophy is worse on the  right. Mild right subarticular narrowing is present. A leftward disc protrusion extends into the foramen with moderate left foraminal stenosis. L4-5: A broad-based disc protrusion is present. Moderate facet hypertrophy is noted bilaterally. This results in moderate-to-severe central canal stenosis. Moderate foraminal narrowing is worse left than right. L5-S1: Advanced facet hypertrophy is present. Endplate spurs are present. Moderate-to-severe subarticular stenosis is present bilaterally. Severe left and moderate right foraminal stenosis is present. SOFT TISSUES: No paraspinal hematoma. LIMITED RETROPERITONEUM: Limited images of the retroperitoneum demonstrate no acute abnormality. Atherosclerotic changes are present in the aorta without aneurysm. Visualized abdomen is otherwise unremarkable. IMPRESSION: 1. Severe degenerative  changes at L5-S1 with advanced facet hypertrophy, moderate-to-severe subarticular stenosis bilaterally, and severe left/moderate right foraminal stenosis. 2. Moderate-to-severe central canal stenosis at L4-5 with moderate facet hypertrophy and moderate foraminal narrowing, worse on the left. 3. Left foraminal stenosis at L3-4. 4. Mild central canal stenosis at L1-2 with moderate facet hypertrophy and moderate right/mild left foraminal narrowing. Electronically signed by: Audree Leas MD 08/11/2023 06:21 PM EDT RP Workstation: [id number]   CT FEMUR LEFT WO CONTRAST Result Date: 08/11/2023 CLINICAL DATA:  Osteomyelitis suspected, femur, xray done L femur rod and now has L leg swelling and pain r/o fracture vs hardware migration EXAM: CT OF THE LOWER LEFT EXTREMITY WITHOUT CONTRAST TECHNIQUE: Multidetector CT imaging of the lower left extremity was performed according to the standard protocol. RADIATION DOSE REDUCTION: This exam was performed according to the departmental dose-optimization program which includes automated exposure control, adjustment of the mA and/or kV according to patient size and/or use of iterative reconstruction technique. COMPARISON:  None Available. FINDINGS: Bones/Joint/Cartilage Left intramedullary rod in place in the left femur across a healed left femoral midshaft fracture. Without prior imaging, is difficult to exclude migration, but no visible obvious hardware complicating feature. No bone destruction to suggest osteomyelitis. Ligaments Suboptimally assessed by CT. Muscles and Tendons Negative Soft tissues Negative IMPRESSION: Left femoral intramedullary rod in place across a healed mid left femoral fracture. No obvious hardware complicating feature. No acute bony abnormality. Electronically Signed   By: Janeece Mechanic M.D.   On: 08/11/2023 18:13    Scheduled Meds:  insulin aspart  0-6 Units Subcutaneous Q4H   lidocaine   1 patch Transdermal Q24H   Continuous Infusions:    LOS: 0 days   Modena Andes, MD [facility]  08/12/2023, 7:55 AM   *Please note that this is a verbal dictation therefore any spelling or grammatical errors are due to the "[employer]agon [employer] One" system interpretation.  Please page via Amion and do not message via secure chat for urgent patient care matters. Secure chat can be used for non urgent patient care matters.  How to contact the [facility] Attending or Consulting provider 7A - 7P or covering provider during after hours 7P -7A, for this patient?  Check the care team in [facility] and look for a) attending/consulting [facility] provider listed and b) the [facility] team listed. Page or secure chat 7A-7P. Log into [email or web address] and use [facility]'s universal password to access. If you do not have the password, please contact the hospital operator. Locate the [facility] provider you are looking for under [facility] and page to a number that you can be directly reached. If you still have difficulty reaching the provider, please page the [facility] (Director on Call) for the Hospitalists listed on amion for assistance.

## 2023-08-12 NOTE — ED Notes (Signed)
CBG 157 

## 2023-08-12 NOTE — Consult Note (Signed)
 Reason for Consult: Left leg pain, lumbar herniated disc Referring Physician: Dr. [name] is an 73 y.o. female.  HPI: The patient is a 73 year old white female who was doing well until 3 days ago.  On that days she had a sudden onset of left anterior leg pain.  She does not recall any precipitating events.  Her primary doctor, Dr. Lorraine Roses has treated her with medications including muscle relaxants, oral prednisone, etc.  The pain became severe and evidently she went to [facility] in [place] but left before she could be seen.  The patient presented to the ER yesterday.  She has a history of left femoral rod.  A CT of the left leg did not demonstrate any hardware complications.  She underwent a lumbar MRI.  This demonstrated chronic degenerative changes as well as an acute left L3-4 far lateral herniated disc.  The patient was admitted by Dr. Brice Campi for pain control.  A neurosurgical consultation was requested.  Presently the patient is alert and pleasant.  She tells me she was not having any significant pain in her back or legs until Thursday when she began having left anterior leg pain.  She perhaps feels a bit better this morning.  Past Medical History:  Diagnosis Date   Diabetes mellitus without complication (HCC)    Hypertension     Past Surgical History:  Procedure Laterality Date   appendectomy     CHOLECYSTECTOMY     FEMUR SURGERY Left    PARTIAL HYSTERECTOMY      History reviewed. No pertinent family history.  Social History:  reports that she has never smoked. She has never used smokeless tobacco. She reports that she does not currently use alcohol. She reports that she does not currently use drugs.  Allergies:  Allergies  Allergen Reactions   Contrast Media [Iodinated Contrast Media] Shortness Of Breath    Patient reports she has had a reaction x40 years ago   Penicillins Anaphylaxis and Rash    Told that her mouth was swollen and she couldn't breath and  told by MD that she shouldn't take it again.   Aspirin     "Burns stomach"    Medications: I have reviewed the patient's current medications. Prior to Admission: (Not in a hospital admission)  Scheduled:  atorvastatin  40 mg Oral Daily   gabapentin  100 mg Oral TID   insulin aspart  0-6 Units Subcutaneous Q4H   irbesartan  150 mg Oral Daily   lidocaine   1 patch Transdermal Q24H   potassium chloride  40 mEq Oral Q4H   trimethoprim-polymyxin b  1 drop Left Eye Q6H   Continuous: PRN:acetaminophen  **OR** acetaminophen , acetaminophen -codeine, bisacodyl, hydrALAZINE, HYDROmorphone  (DILAUDID ) injection, methocarbamol, oxyCODONE , polyethylene glycol, prochlorperazine Anti-infectives (From admission, onward)    None        Results for orders placed or performed during the hospital encounter of 08/11/23 (from the past 48 hours)  CBC with Differential     Status: None   Collection Time: 08/11/23  4:14 PM  Result Value Ref Range   WBC 9.3 4.0 - 10.5 K/uL   RBC 4.64 3.87 - 5.11 MIL/uL   Hemoglobin 13.6 12.0 - 15.0 g/dL   HCT 40.9 81.1 - 91.4 %   MCV 89.9 80.0 - 100.0 fL   MCH 29.3 26.0 - 34.0 pg   MCHC 32.6 30.0 - 36.0 g/dL   RDW 78.2 95.6 - 21.3 %   Platelets 320 150 - 400 K/uL  nRBC 0.0 0.0 - 0.2 %   Neutrophils Relative % 70 %   Neutro Abs 6.5 1.7 - 7.7 K/uL   Lymphocytes Relative 25 %   Lymphs Abs 2.3 0.7 - 4.0 K/uL   Monocytes Relative 4 %   Monocytes Absolute 0.4 0.1 - 1.0 K/uL   Eosinophils Relative 0 %   Eosinophils Absolute 0.0 0.0 - 0.5 K/uL   Basophils Relative 0 %   Basophils Absolute 0.0 0.0 - 0.1 K/uL   Immature Granulocytes 1 %   Abs Immature Granulocytes 0.05 0.00 - 0.07 K/uL    Comment: Performed at [facility], [address] [place], [place] [address]  CK     Status: None   Collection Time: 08/11/23  4:14 PM  Result Value Ref Range   Total CK 58 38 - 234 U/L    Comment: Performed at [facility], [address], [place], [place] [address]   Comprehensive metabolic panel     Status: Abnormal   Collection Time: 08/11/23  4:14 PM  Result Value Ref Range   Sodium 138 135 - 145 mmol/L   Potassium 3.8 3.5 - 5.1 mmol/L   Chloride 104 98 - 111 mmol/L   CO2 18 (L) 22 - 32 mmol/L   Glucose, Bld 157 (H) 70 - 99 mg/dL    Comment: Glucose reference range applies only to samples taken after fasting for at least 8 hours.   BUN 25 (H) 8 - 23 mg/dL   Creatinine, Ser 1.91 (H) 0.44 - 1.00 mg/dL   Calcium 9.7 8.9 - 47.8 mg/dL   Total Protein 8.4 (H) 6.5 - 8.1 g/dL   Albumin 4.6 3.5 - 5.0 g/dL   AST 33 15 - 41 U/L   ALT 16 0 - 44 U/L   Alkaline Phosphatase 79 38 - 126 U/L   Total Bilirubin 1.2 0.0 - 1.2 mg/dL   GFR, Estimated 56 (L) >60 mL/min    Comment: (NOTE) Calculated using the CKD-EPI Creatinine Equation (2021)    Anion gap 16 (H) 5 - 15    Comment: Performed at [facility], [address] [place], [place] [address]  CBG monitoring, ED     Status: Abnormal   Collection Time: 08/12/23  5:15 AM  Result Value Ref Range   Glucose-Capillary 164 (H) 70 - 99 mg/dL    Comment: Glucose reference range applies only to samples taken after fasting for at least 8 hours.  Basic metabolic panel     Status: Abnormal   Collection Time: 08/12/23  5:28 AM  Result Value Ref Range   Sodium 138 135 - 145 mmol/L   Potassium 3.4 (L) 3.5 - 5.1 mmol/L   Chloride 106 98 - 111 mmol/L   CO2 19 (L) 22 - 32 mmol/L   Glucose, Bld 163 (H) 70 - 99 mg/dL    Comment: Glucose reference range applies only to samples taken after fasting for at least 8 hours.   BUN 28 (H) 8 - 23 mg/dL   Creatinine, Ser 1.30 0.44 - 1.00 mg/dL   Calcium 8.9 8.9 - 86.5 mg/dL   GFR, Estimated >78 >46 mL/min    Comment: (NOTE) Calculated using the CKD-EPI Creatinine Equation (2021)    Anion gap 13 5 - 15    Comment: Performed at [facility], [address], [place], [place] [address]  CBC     Status: None   Collection Time: 08/12/23  5:28 AM  Result  Value Ref  Range   WBC 9.2 4.0 - 10.5 K/uL   RBC 4.17 3.87 - 5.11 MIL/uL   Hemoglobin 12.4 12.0 - 15.0 g/dL   HCT 40.9 81.1 - 91.4 %   MCV 89.4 80.0 - 100.0 fL   MCH 29.7 26.0 - 34.0 pg   MCHC 33.2 30.0 - 36.0 g/dL   RDW 78.2 95.6 - 21.3 %   Platelets 306 150 - 400 K/uL   nRBC 0.0 0.0 - 0.2 %    Comment: Performed at [facility], [address], [place], [place] [address]    VAS US  LOWER EXTREMITY VENOUS (DVT) (ONLY [facility] & [facility]) Result Date: 08/12/2023  Lower Venous DVT Study Patient Name:  Priscilla Adams  Date of Exam:   08/11/2023 Medical Rec #: [id number]       Accession #:    [id number] Date of Birth: March 14, 1950       Patient Gender: F Patient Age:   38 years Exam Location:  [facility] Procedure:      VAS US  LOWER EXTREMITY VENOUS (DVT) Referring Phys: Paris Bolds --------------------------------------------------------------------------------  Indications: Pain.  Comparison Study: Prior negative left LEV done 02/07/19 at [facility] Performing Technologist: Carleene Chase RVS  Examination Guidelines: A complete evaluation includes B-mode imaging, spectral Doppler, color Doppler, and power Doppler as needed of all accessible portions of each vessel. Bilateral testing is considered an integral part of a complete examination. Limited examinations for reoccurring indications may be performed as noted. The reflux portion of the exam is performed with the patient in reverse Trendelenburg.  +-----+---------------+---------+-----------+----------+--------------+ RIGHTCompressibilityPhasicitySpontaneityPropertiesThrombus Aging +-----+---------------+---------+-----------+----------+--------------+ CFV  Full           Yes      Yes                                 +-----+---------------+---------+-----------+----------+--------------+ SFJ  Full                                                        +-----+---------------+---------+-----------+----------+--------------+    +---------+---------------+---------+-----------+----------+--------------+ LEFT     CompressibilityPhasicitySpontaneityPropertiesThrombus Aging +---------+---------------+---------+-----------+----------+--------------+ CFV      Full           Yes      Yes                                 +---------+---------------+---------+-----------+----------+--------------+ SFJ      Full                                                        +---------+---------------+---------+-----------+----------+--------------+ FV Prox  Full                                                        +---------+---------------+---------+-----------+----------+--------------+ FV Mid   Full                                                        +---------+---------------+---------+-----------+----------+--------------+  FV DistalFull                                                        +---------+---------------+---------+-----------+----------+--------------+ PFV      Full                                                        +---------+---------------+---------+-----------+----------+--------------+ POP      Full           Yes      Yes                                 +---------+---------------+---------+-----------+----------+--------------+ PTV      Full                                                        +---------+---------------+---------+-----------+----------+--------------+ PERO     Full                                                        +---------+---------------+---------+-----------+----------+--------------+     Summary: RIGHT: - No evidence of common femoral vein obstruction.   LEFT: - There is no evidence of deep vein thrombosis in the lower extremity.  - A cystic structure is found in the popliteal fossa measuring 1.05 cm x 2.25 cm.  *See table(s) above for measurements and observations. Electronically signed by Delaney Fearing on 08/12/2023 at 7:39:20 AM.     Final    MR Lumbar Spine W Wo Contrast Result Date: 08/11/2023 CLINICAL DATA:  Low back pain with difficulty walking EXAM: MRI LUMBAR SPINE WITHOUT AND WITH CONTRAST TECHNIQUE: Multiplanar and multiecho pulse sequences of the lumbar spine were obtained without and with intravenous contrast. CONTRAST:  9mL GADAVIST GADOBUTROL 1 MMOL/ML IV SOLN COMPARISON:  None Available. FINDINGS: Segmentation:  Standard. Alignment:  Physiologic. Vertebrae:  No fracture, evidence of discitis, or bone lesion. Conus medullaris and cauda equina: Conus extends to the L1 level. Conus and cauda equina appear normal. Paraspinal and other soft tissues: Negative. Disc levels: L1-L2: Small central disc protrusion and mild facet hypertrophy. No spinal canal stenosis. No neural foraminal stenosis. L2-L3: Normal disc space and facet joints. No spinal canal stenosis. No neural foraminal stenosis. L3-L4: Normal disc space and facet joints. No spinal canal stenosis. No neural foraminal stenosis. L4-L5: Small disc bulge with moderate facet hypertrophy. Narrowing of both lateral recesses without central spinal canal stenosis. No neural foraminal stenosis. L5-S1: Small disc bulge with endplate spurring and severe facet hypertrophy. No spinal canal stenosis. Moderate right and severe left neural foraminal stenosis. Visualized sacrum: Normal. IMPRESSION: 1. Moderate right and severe left L5-S1 neural foraminal stenosis due to combination of disc bulge, endplate spurring and severe facet arthrosis. 2. Bilateral lateral recess stenosis at L4-L5, which  may serve as a source of L5 radiculopathy. Electronically Signed   By: Juanetta Nordmann M.D.   On: 08/11/2023 20:44   CT Lumbar Spine Wo Contrast Result Date: 08/11/2023 EXAM: CT OF THE LUMBAR SPINE WITHOUT CONTRAST 08/11/2023 06:05:20 PM TECHNIQUE: CT of the lumbar spine was performed without the administration of intravenous contrast. Multiplanar reformatted images are provided for review. Automated  exposure control, iterative reconstruction, and/or weight based adjustment of the mA/kV was utilized to reduce the radiation dose to as low as reasonably achievable. COMPARISON: None available. CLINICAL HISTORY: Low back pain, increased fracture risk. Was seen at [facility] on 08/09/23 and provided gabapentin, lidocaine  patch. 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 FINDINGS: BONES AND ALIGNMENT: Straightening of the normal lumbar lordosis is present. No significant listhesis is present. Mild leftward curvature is present in the mid lumbar spine. Degenerative changes are present at the SI joints bilaterally. DEGENERATIVE CHANGES: T12-L1: Asymmetric left-sided facet hypertrophy results in mild left foraminal stenosis. A broad-based disc protrusion and moderate facet hypertrophy are present bilaterally at L1-2. Mild central canal stenosis is present. Moderate right and mild left foraminal narrowing is present. L2-3: Rightward disc bulge is present. Facet hypertrophy is worse on the right. No focal stenosis is present. L3-4: A broad-based disc protrusion is present. Facet hypertrophy is worse on the right. Mild right subarticular narrowing is present. A leftward disc protrusion extends into the foramen with moderate left foraminal stenosis. L4-5: A broad-based disc protrusion is present. Moderate facet hypertrophy is noted bilaterally. This results in moderate-to-severe central canal stenosis. Moderate foraminal narrowing is worse left than right. L5-S1: Advanced facet hypertrophy is present. Endplate spurs are present. Moderate-to-severe subarticular stenosis is present bilaterally. Severe left and moderate right foraminal stenosis is present. SOFT TISSUES: No paraspinal hematoma. LIMITED RETROPERITONEUM: Limited images of the retroperitoneum demonstrate no acute abnormality. Atherosclerotic changes are present in the aorta without aneurysm.  Visualized abdomen is otherwise unremarkable. IMPRESSION: 1. Severe degenerative changes at L5-S1 with advanced facet hypertrophy, moderate-to-severe subarticular stenosis bilaterally, and severe left/moderate right foraminal stenosis. 2. Moderate-to-severe central canal stenosis at L4-5 with moderate facet hypertrophy and moderate foraminal narrowing, worse on the left. 3. Left foraminal stenosis at L3-4. 4. Mild central canal stenosis at L1-2 with moderate facet hypertrophy and moderate right/mild left foraminal narrowing. Electronically signed by: Audree Leas MD 08/11/2023 06:21 PM EDT RP Workstation: [id number]   CT FEMUR LEFT WO CONTRAST Result Date: 08/11/2023 CLINICAL DATA:  Osteomyelitis suspected, femur, xray done L femur rod and now has L leg swelling and pain r/o fracture vs hardware migration EXAM: CT OF THE LOWER LEFT EXTREMITY WITHOUT CONTRAST TECHNIQUE: Multidetector CT imaging of the lower left extremity was performed according to the standard protocol. RADIATION DOSE REDUCTION: This exam was performed according to the departmental dose-optimization program which includes automated exposure control, adjustment of the mA and/or kV according to patient size and/or use of iterative reconstruction technique. COMPARISON:  None Available. FINDINGS: Bones/Joint/Cartilage Left intramedullary rod in place in the left femur across a healed left femoral midshaft fracture. Without prior imaging, is difficult to exclude migration, but no visible obvious hardware complicating feature. No bone destruction to suggest osteomyelitis. Ligaments Suboptimally assessed by CT. Muscles and Tendons Negative Soft tissues Negative IMPRESSION: Left femoral intramedullary rod in place across a healed mid left femoral fracture. No obvious hardware complicating feature. No acute bony abnormality. Electronically Signed   By: Janeece Mechanic  M.D.   On: 08/11/2023 18:13    ROS: As above Blood pressure (!) 145/68, pulse 80,  temperature 98 F (36.7 C), temperature source Oral, resp. rate 20, SpO2 100%. Estimated body mass index is 31.32 kg/m as calculated from the following:   Height as of 08/09/23: 5\' 7"  (1.702 m).   Weight as of 08/09/23: 90.7 kg.  Physical Exam  General: A pleasant 73 year old white female in no apparent distress  HEENT: Normocephalic, atraumatic, extraocular muscles are intact  Neck: Unremarkable, Spurling's testing is negative  Thorax: Symmetric  Abdomen: Obese and soft  Extremities: Unremarkable  Back exam: Faber's testing is negative bilaterally.  Straight leg raise testing is positive on the left.  Neurologic exam: The patient is alert and oriented x 3.  Cranial nerves II through XII were grossly normal bilaterally.  The patient's motor strength is grossly normal except she has some slight weakness in her left iliopsoas and quadriceps.  Sensory function is grossly intact to light touch sensation in all tested dermatomes bilaterally.  Cerebellar function is intact to rapid alternating movements of the upper extremities bilaterally.  I reviewed the patient's lumbar MRI performed yesterday.  She has chronic appearing degenerative changes/facet arthropathy at L4-5 with mild stenosis.  She has a far lateral herniated disc at L3-4 on the left.   Assessment/Plan: Left L3-4 far lateral hernia disc, lumbar radiculopathy, lumbago: I have discussed the situation with the patient.  I told her I think what is causing her acute left leg pain is the far lateral herniated disc at L3-4.  We have discussed the various treatment options including doing nothing, continue medical management/physical therapy, lumbar injections, and surgery.  I described the surgical treatment option of left L3-4 discectomy.  We discussed the risk including risk of anesthesia, hemorrhage, infection, current ruptured disc, nerve injury, medical risk, failure to relieve the pain, etc.  I have answered all her  questions.  At this point she wants to continue with medical management given time.  I will sign off.  Please have her follow-up with me in the office.  Please call if I can be of further assistance.  Priscilla Adams 08/12/2023, 8:34 AM

## 2023-08-13 ENCOUNTER — Other Ambulatory Visit: Payer: Self-pay

## 2023-08-13 ENCOUNTER — Encounter (HOSPITAL_COMMUNITY): Payer: Self-pay | Admitting: Family Medicine

## 2023-08-13 ENCOUNTER — Encounter (HOSPITAL_COMMUNITY)
Admission: EM | Disposition: A | Discharge status: Home / Self Care | Payer: Self-pay | Source: Home / Self Care | Attending: Neurosurgery

## 2023-08-13 ENCOUNTER — Inpatient Hospital Stay (HOSPITAL_COMMUNITY)

## 2023-08-13 DIAGNOSIS — M5416 Radiculopathy, lumbar region: Secondary | ICD-10-CM | POA: Diagnosis present

## 2023-08-13 DIAGNOSIS — M5116 Intervertebral disc disorders with radiculopathy, lumbar region: Secondary | ICD-10-CM

## 2023-08-13 DIAGNOSIS — E876 Hypokalemia: Secondary | ICD-10-CM | POA: Diagnosis present

## 2023-08-13 DIAGNOSIS — R0902 Hypoxemia: Secondary | ICD-10-CM | POA: Diagnosis not present

## 2023-08-13 DIAGNOSIS — E119 Type 2 diabetes mellitus without complications: Secondary | ICD-10-CM

## 2023-08-13 DIAGNOSIS — I10 Essential (primary) hypertension: Secondary | ICD-10-CM

## 2023-08-13 DIAGNOSIS — E785 Hyperlipidemia, unspecified: Secondary | ICD-10-CM | POA: Diagnosis present

## 2023-08-13 DIAGNOSIS — E1165 Type 2 diabetes mellitus with hyperglycemia: Secondary | ICD-10-CM | POA: Diagnosis present

## 2023-08-13 DIAGNOSIS — J9589 Other postprocedural complications and disorders of respiratory system, not elsewhere classified: Secondary | ICD-10-CM | POA: Diagnosis not present

## 2023-08-13 DIAGNOSIS — G43909 Migraine, unspecified, not intractable, without status migrainosus: Secondary | ICD-10-CM | POA: Diagnosis present

## 2023-08-13 DIAGNOSIS — I959 Hypotension, unspecified: Secondary | ICD-10-CM | POA: Diagnosis not present

## 2023-08-13 DIAGNOSIS — Z7984 Long term (current) use of oral hypoglycemic drugs: Secondary | ICD-10-CM

## 2023-08-13 DIAGNOSIS — M5126 Other intervertebral disc displacement, lumbar region: Secondary | ICD-10-CM | POA: Diagnosis present

## 2023-08-13 DIAGNOSIS — M4807 Spinal stenosis, lumbosacral region: Secondary | ICD-10-CM | POA: Diagnosis present

## 2023-08-13 DIAGNOSIS — Z7985 Long-term (current) use of injectable non-insulin antidiabetic drugs: Secondary | ICD-10-CM | POA: Diagnosis not present

## 2023-08-13 DIAGNOSIS — Z91041 Radiographic dye allergy status: Secondary | ICD-10-CM | POA: Diagnosis not present

## 2023-08-13 DIAGNOSIS — Y848 Other medical procedures as the cause of abnormal reaction of the patient, or of later complication, without mention of misadventure at the time of the procedure: Secondary | ICD-10-CM | POA: Diagnosis not present

## 2023-08-13 DIAGNOSIS — T886XXA Anaphylactic reaction due to adverse effect of correct drug or medicament properly administered, initial encounter: Secondary | ICD-10-CM | POA: Diagnosis not present

## 2023-08-13 DIAGNOSIS — I472 Ventricular tachycardia, unspecified: Secondary | ICD-10-CM | POA: Diagnosis not present

## 2023-08-13 DIAGNOSIS — G8929 Other chronic pain: Secondary | ICD-10-CM | POA: Diagnosis present

## 2023-08-13 DIAGNOSIS — R52 Pain, unspecified: Secondary | ICD-10-CM | POA: Diagnosis not present

## 2023-08-13 DIAGNOSIS — Z0189 Encounter for other specified special examinations: Secondary | ICD-10-CM | POA: Diagnosis not present

## 2023-08-13 HISTORY — PX: LUMBAR LAMINECTOMY/DECOMPRESSION MICRODISCECTOMY: SHX5026

## 2023-08-13 LAB — POCT I-STAT 7, (LYTES, BLD GAS, ICA,H+H)
Acid-base deficit: 10 mmol/L — ABNORMAL HIGH (ref 0.0–2.0)
Bicarbonate: 15.2 mmol/L — ABNORMAL LOW (ref 20.0–28.0)
Calcium, Ion: 1.18 mmol/L (ref 1.15–1.40)
HCT: 34 % — ABNORMAL LOW (ref 36.0–46.0)
Hemoglobin: 11.6 g/dL — ABNORMAL LOW (ref 12.0–15.0)
O2 Saturation: 99 %
Patient temperature: 98.6
Potassium: 3.5 mmol/L (ref 3.5–5.1)
Sodium: 140 mmol/L (ref 135–145)
TCO2: 16 mmol/L — ABNORMAL LOW (ref 22–32)
pCO2 arterial: 31.5 mmHg — ABNORMAL LOW (ref 32–48)
pH, Arterial: 7.291 — ABNORMAL LOW (ref 7.35–7.45)
pO2, Arterial: 165 mmHg — ABNORMAL HIGH (ref 83–108)

## 2023-08-13 LAB — SURGICAL PCR SCREEN
MRSA, PCR: NEGATIVE
Staphylococcus aureus: NEGATIVE

## 2023-08-13 LAB — BASIC METABOLIC PANEL WITH GFR
Anion gap: 12 (ref 5–15)
BUN: 26 mg/dL — ABNORMAL HIGH (ref 8–23)
CO2: 19 mmol/L — ABNORMAL LOW (ref 22–32)
Calcium: 9.1 mg/dL (ref 8.9–10.3)
Chloride: 107 mmol/L (ref 98–111)
Creatinine, Ser: 0.81 mg/dL (ref 0.44–1.00)
GFR, Estimated: >60 mL/min (ref >60)
Glucose, Bld: 146 mg/dL — ABNORMAL HIGH (ref 70–99)
Potassium: 3.8 mmol/L (ref 3.5–5.1)
Sodium: 138 mmol/L (ref 135–145)

## 2023-08-13 LAB — CBC
HCT: 41.3 % (ref 36.0–46.0)
Hemoglobin: 13.5 g/dL (ref 12.0–15.0)
MCH: 29.5 pg (ref 26.0–34.0)
MCHC: 32.7 g/dL (ref 30.0–36.0)
MCV: 90.2 fL (ref 80.0–100.0)
Platelets: 376 10*3/uL (ref 150–400)
RBC: 4.58 MIL/uL (ref 3.87–5.11)
RDW: 13.5 % (ref 11.5–15.5)
WBC: 10.8 10*3/uL — ABNORMAL HIGH (ref 4.0–10.5)
nRBC: 0 % (ref 0.0–0.2)

## 2023-08-13 LAB — GLUCOSE, CAPILLARY
Glucose-Capillary: 132 mg/dL — ABNORMAL HIGH (ref 70–99)
Glucose-Capillary: 143 mg/dL — ABNORMAL HIGH (ref 70–99)
Glucose-Capillary: 153 mg/dL — ABNORMAL HIGH (ref 70–99)
Glucose-Capillary: 149 mg/dL — ABNORMAL HIGH (ref 70–99)
Glucose-Capillary: 188 mg/dL — ABNORMAL HIGH (ref 70–99)
Glucose-Capillary: 189 mg/dL — ABNORMAL HIGH (ref 70–99)

## 2023-08-13 LAB — HEMOGLOBIN A1C
Hgb A1c MFr Bld: 6.8 % — ABNORMAL HIGH (ref 4.8–5.6)
Mean Plasma Glucose: 148 mg/dL

## 2023-08-13 SURGERY — LUMBAR LAMINECTOMY/DECOMPRESSION MICRODISCECTOMY 1 LEVEL
Anesthesia: General

## 2023-08-13 MED ORDER — HYDROMORPHONE HCL 1 MG/ML IJ SOLN: 0.2500 mg | INTRAMUSCULAR | Status: DC | PRN

## 2023-08-13 MED ORDER — OXYCODONE HCL 5 MG PO TABS: 5.0000 mg | ORAL_TABLET | Freq: Once | ORAL | Status: DC | PRN

## 2023-08-13 MED ORDER — ONDANSETRON HCL 4 MG/2ML IJ SOLN
INTRAMUSCULAR | Status: AC
  Filled 2023-08-13: qty 2

## 2023-08-13 MED ORDER — BUPIVACAINE-EPINEPHRINE (PF) 0.5% -1:200000 IJ SOLN
INTRAMUSCULAR | Status: AC
  Filled 2023-08-13: qty 30

## 2023-08-13 MED ORDER — DOCUSATE SODIUM 100 MG PO CAPS
100.0000 mg | ORAL_CAPSULE | Freq: Two times a day (BID) | ORAL | Status: DC
  Administered 2023-08-14 – 2023-08-17 (×2): 100 mg via ORAL
  Filled 2023-08-13 (×5): qty 1

## 2023-08-13 MED ORDER — AMISULPRIDE (ANTIEMETIC) 5 MG/2ML IV SOLN
10.0000 mg | Freq: Once | INTRAVENOUS | Status: DC | PRN
Start: 2023-08-13 — End: 2023-08-13

## 2023-08-13 MED ORDER — LACTATED RINGERS IV SOLN: INTRAVENOUS | Status: DC | PRN

## 2023-08-13 MED ORDER — LIDOCAINE 2% (20 MG/ML) 5 ML SYRINGE
INTRAMUSCULAR | Status: DC | PRN
  Administered 2023-08-13: 80 mg via INTRAVENOUS

## 2023-08-13 MED ORDER — 0.9 % SODIUM CHLORIDE (POUR BTL) OPTIME
TOPICAL | Status: DC | PRN
  Administered 2023-08-13: 1000 mL

## 2023-08-13 MED ORDER — MIDAZOLAM HCL 2 MG/2ML IJ SOLN
INTRAMUSCULAR | Status: AC
  Filled 2023-08-13: qty 2

## 2023-08-13 MED ORDER — ORAL CARE MOUTH RINSE: 15.0000 mL | OROMUCOSAL | Status: DC | PRN

## 2023-08-13 MED ORDER — KETAMINE HCL 50 MG/5ML IJ SOSY
PREFILLED_SYRINGE | INTRAMUSCULAR | Status: AC
Start: 2023-08-13 — End: ?
  Filled 2023-08-13: qty 5

## 2023-08-13 MED ORDER — FAMOTIDINE IN NACL 20-0.9 MG/50ML-% IV SOLN
20.0000 mg | Freq: Two times a day (BID) | INTRAVENOUS | Status: DC
  Administered 2023-08-13 – 2023-08-14 (×2): 20 mg via INTRAVENOUS
  Filled 2023-08-13 (×2): qty 50

## 2023-08-13 MED ORDER — PHENYLEPHRINE 80 MCG/ML (10ML) SYRINGE FOR IV PUSH (FOR BLOOD PRESSURE SUPPORT)
PREFILLED_SYRINGE | INTRAVENOUS | Status: DC | PRN
  Administered 2023-08-13: 240 ug via INTRAVENOUS

## 2023-08-13 MED ORDER — CHLORHEXIDINE GLUCONATE CLOTH 2 % EX PADS
6.0000 | MEDICATED_PAD | Freq: Every day | CUTANEOUS | Status: DC
  Administered 2023-08-13 – 2023-08-17 (×5): 6 via TOPICAL

## 2023-08-13 MED ORDER — VASOPRESSIN 20 UNIT/ML IV SOLN
INTRAVENOUS | Status: DC | PRN
  Administered 2023-08-13 (×2): 2 [IU] via INTRAVENOUS
  Administered 2023-08-13: 4 [IU] via INTRAVENOUS

## 2023-08-13 MED ORDER — BISACODYL 10 MG RE SUPP: 10.0000 mg | Freq: Every day | RECTAL | Status: DC | PRN

## 2023-08-13 MED ORDER — PHENYLEPHRINE HCL-NACL 20-0.9 MG/250ML-% IV SOLN
INTRAVENOUS | Status: DC | PRN
  Administered 2023-08-13: 10 ug/min via INTRAVENOUS
  Administered 2023-08-13: 50 ug/min via INTRAVENOUS

## 2023-08-13 MED ORDER — HYDROCORTISONE SOD SUC (PF) 250 MG IJ SOLR
INTRAMUSCULAR | Status: AC
  Filled 2023-08-13: qty 250

## 2023-08-13 MED ORDER — PROPOFOL 10 MG/ML IV BOLUS
INTRAVENOUS | Status: AC
  Filled 2023-08-13: qty 20

## 2023-08-13 MED ORDER — INSULIN ASPART 100 UNIT/ML IJ SOLN: 0.0000 [IU] | INTRAMUSCULAR | Status: DC | PRN

## 2023-08-13 MED ORDER — BUPIVACAINE-EPINEPHRINE (PF) 0.5% -1:200000 IJ SOLN
INTRAMUSCULAR | Status: DC | PRN
  Administered 2023-08-13: 10 mL

## 2023-08-13 MED ORDER — PHENYLEPHRINE 80 MCG/ML (10ML) SYRINGE FOR IV PUSH (FOR BLOOD PRESSURE SUPPORT)
PREFILLED_SYRINGE | INTRAVENOUS | Status: AC
  Filled 2023-08-13: qty 10

## 2023-08-13 MED ORDER — HYDROMORPHONE HCL 1 MG/ML IJ SOLN
INTRAMUSCULAR | Status: AC
  Filled 2023-08-13: qty 0.5

## 2023-08-13 MED ORDER — ORAL CARE MOUTH RINSE: 15.0000 mL | Freq: Once | OROMUCOSAL | Status: AC

## 2023-08-13 MED ORDER — ALBUMIN HUMAN 5 % IV SOLN: INTRAVENOUS | Status: DC | PRN

## 2023-08-13 MED ORDER — SODIUM CHLORIDE 0.9% FLUSH: 3.0000 mL | INTRAVENOUS | Status: DC | PRN

## 2023-08-13 MED ORDER — HYDROCORTISONE SOD SUC (PF) 100 MG IJ SOLR
INTRAMUSCULAR | Status: DC | PRN
Start: 2023-08-13 — End: 2023-08-13
  Administered 2023-08-13: 125 mg via INTRAVENOUS

## 2023-08-13 MED ORDER — THROMBIN 5000 UNITS EX SOLR
OROMUCOSAL | Status: DC | PRN
  Administered 2023-08-13: 5 mL via TOPICAL

## 2023-08-13 MED ORDER — ACETAMINOPHEN 10 MG/ML IV SOLN: 1000.0000 mg | Freq: Once | INTRAVENOUS | Status: DC | PRN

## 2023-08-13 MED ORDER — VANCOMYCIN HCL 1000 MG IV SOLR
INTRAVENOUS | Status: DC | PRN
  Administered 2023-08-13: 1000 mg via INTRAVENOUS

## 2023-08-13 MED ORDER — SODIUM CHLORIDE 0.9% FLUSH
3.0000 mL | Freq: Two times a day (BID) | INTRAVENOUS | Status: DC
  Administered 2023-08-13: 3 mL via INTRAVENOUS

## 2023-08-13 MED ORDER — MENTHOL 3 MG MT LOZG: 1.0000 | LOZENGE | OROMUCOSAL | Status: DC | PRN

## 2023-08-13 MED ORDER — LACTATED RINGERS IV SOLN: INTRAVENOUS | Status: DC

## 2023-08-13 MED ORDER — ROCURONIUM BROMIDE 10 MG/ML (PF) SYRINGE
PREFILLED_SYRINGE | INTRAVENOUS | Status: DC | PRN
  Administered 2023-08-13: 60 mg via INTRAVENOUS

## 2023-08-13 MED ORDER — PROPOFOL 1000 MG/100ML IV EMUL
0.0000 ug/kg/min | INTRAVENOUS | Status: DC
  Administered 2023-08-13: 30 ug/kg/min via INTRAVENOUS
  Administered 2023-08-14: 20 ug/kg/min via INTRAVENOUS
  Filled 2023-08-13 (×2): qty 100

## 2023-08-13 MED ORDER — KETAMINE HCL 10 MG/ML IJ SOLN
INTRAMUSCULAR | Status: DC | PRN
  Administered 2023-08-13: 30 mg via INTRAVENOUS
  Administered 2023-08-13: 20 mg via INTRAVENOUS

## 2023-08-13 MED ORDER — DEXMEDETOMIDINE HCL IN NACL 400 MCG/100ML IV SOLN
INTRAVENOUS | Status: AC
  Filled 2023-08-13: qty 100

## 2023-08-13 MED ORDER — LIDOCAINE 2% (20 MG/ML) 5 ML SYRINGE
INTRAMUSCULAR | Status: AC
  Filled 2023-08-13: qty 5

## 2023-08-13 MED ORDER — BACITRACIN ZINC 500 UNIT/GM EX OINT
TOPICAL_OINTMENT | CUTANEOUS | Status: DC | PRN
  Administered 2023-08-13: 1 via TOPICAL

## 2023-08-13 MED ORDER — CHLORHEXIDINE GLUCONATE 0.12 % MT SOLN
OROMUCOSAL | Status: AC
  Administered 2023-08-13: 15 mL via OROMUCOSAL
  Filled 2023-08-13: qty 15

## 2023-08-13 MED ORDER — FAMOTIDINE IN NACL 20-0.9 MG/50ML-% IV SOLN
INTRAVENOUS | Status: DC | PRN
  Administered 2023-08-13: 20 mg via INTRAVENOUS

## 2023-08-13 MED ORDER — FENTANYL CITRATE (PF) 250 MCG/5ML IJ SOLN
INTRAMUSCULAR | Status: DC | PRN
Start: 2023-08-13 — End: 2023-08-13
  Administered 2023-08-13: 50 ug via INTRAVENOUS
  Administered 2023-08-13 (×2): 100 ug via INTRAVENOUS

## 2023-08-13 MED ORDER — HYDRALAZINE HCL 20 MG/ML IJ SOLN: 10.0000 mg | INTRAMUSCULAR | Status: DC | PRN

## 2023-08-13 MED ORDER — SODIUM CHLORIDE 0.9 % IV SOLN
250.0000 mL | INTRAVENOUS | Status: DC
  Administered 2023-08-13: 250 mL via INTRAVENOUS

## 2023-08-13 MED ORDER — DEXAMETHASONE SODIUM PHOSPHATE 10 MG/ML IJ SOLN
INTRAMUSCULAR | Status: AC
  Filled 2023-08-13: qty 1

## 2023-08-13 MED ORDER — CEFAZOLIN SODIUM-DEXTROSE 2-4 GM/100ML-% IV SOLN
INTRAVENOUS | Status: AC
  Filled 2023-08-13: qty 100

## 2023-08-13 MED ORDER — MIDAZOLAM HCL 2 MG/2ML IJ SOLN
INTRAMUSCULAR | Status: DC | PRN
  Administered 2023-08-13: 2 mg via INTRAVENOUS

## 2023-08-13 MED ORDER — CHLORHEXIDINE GLUCONATE 0.12 % MT SOLN: 15.0000 mL | Freq: Once | OROMUCOSAL | Status: AC

## 2023-08-13 MED ORDER — VASOPRESSIN 20 UNIT/ML IV SOLN
INTRAVENOUS | Status: AC
  Filled 2023-08-13: qty 1

## 2023-08-13 MED ORDER — ONDANSETRON HCL 4 MG/2ML IJ SOLN
INTRAMUSCULAR | Status: DC | PRN
  Administered 2023-08-13: 4 mg via INTRAVENOUS

## 2023-08-13 MED ORDER — VANCOMYCIN HCL IN DEXTROSE 1-5 GM/200ML-% IV SOLN
1000.0000 mg | Freq: Once | INTRAVENOUS | Status: AC
  Administered 2023-08-14: 1000 mg via INTRAVENOUS
  Filled 2023-08-13: qty 200

## 2023-08-13 MED ORDER — METHYLPREDNISOLONE SODIUM SUCC 40 MG IJ SOLR
40.0000 mg | Freq: Two times a day (BID) | INTRAMUSCULAR | Status: AC
  Administered 2023-08-13 – 2023-08-15 (×5): 40 mg via INTRAVENOUS
  Filled 2023-08-13 (×5): qty 1

## 2023-08-13 MED ORDER — SODIUM BICARBONATE 8.4 % IV SOLN
50.0000 meq | Freq: Once | INTRAVENOUS | Status: AC
  Administered 2023-08-13: 50 meq via INTRAVENOUS
  Filled 2023-08-13: qty 50

## 2023-08-13 MED ORDER — ESMOLOL HCL 100 MG/10ML IV SOLN
INTRAVENOUS | Status: DC | PRN
Start: 2023-08-13 — End: 2023-08-13
  Administered 2023-08-13 (×2): 20 mg via INTRAVENOUS

## 2023-08-13 MED ORDER — VANCOMYCIN HCL IN DEXTROSE 1-5 GM/200ML-% IV SOLN
INTRAVENOUS | Status: AC
  Filled 2023-08-13: qty 200

## 2023-08-13 MED ORDER — OXYCODONE HCL 5 MG/5ML PO SOLN: 5.0000 mg | Freq: Once | ORAL | Status: DC | PRN

## 2023-08-13 MED ORDER — PROPOFOL 1000 MG/100ML IV EMUL
INTRAVENOUS | Status: AC
  Filled 2023-08-13: qty 100

## 2023-08-13 MED ORDER — VASOPRESSIN 20 UNITS/100 ML INFUSION FOR SHOCK
INTRAVENOUS | Status: DC | PRN
  Administered 2023-08-13: .03 [IU]/min via INTRAVENOUS

## 2023-08-13 MED ORDER — ONDANSETRON HCL 4 MG/2ML IJ SOLN: 4.0000 mg | Freq: Once | INTRAMUSCULAR | Status: DC | PRN

## 2023-08-13 MED ORDER — THROMBIN 5000 UNITS EX KIT
PACK | CUTANEOUS | Status: AC
Start: 2023-08-13 — End: ?
  Filled 2023-08-13: qty 1

## 2023-08-13 MED ORDER — ACETAMINOPHEN 500 MG PO TABS
1000.0000 mg | ORAL_TABLET | Freq: Four times a day (QID) | ORAL | Status: AC
  Administered 2023-08-14 (×2): 1000 mg via ORAL
  Filled 2023-08-13 (×2): qty 2

## 2023-08-13 MED ORDER — PHENOL 1.4 % MT LIQD
1.0000 | OROMUCOSAL | Status: AC | PRN
Start: 2023-08-13 — End: ?

## 2023-08-13 MED ORDER — PHENYLEPHRINE HCL (PRESSORS) 10 MG/ML IV SOLN
INTRAVENOUS | Status: AC
  Filled 2023-08-13: qty 1

## 2023-08-13 MED ORDER — FENTANYL CITRATE (PF) 250 MCG/5ML IJ SOLN
INTRAMUSCULAR | Status: AC
  Filled 2023-08-13: qty 5

## 2023-08-13 MED ORDER — FAMOTIDINE IN NACL 20-0.9 MG/50ML-% IV SOLN
INTRAVENOUS | Status: AC
  Filled 2023-08-13: qty 50

## 2023-08-13 MED ORDER — ROCURONIUM BROMIDE 10 MG/ML (PF) SYRINGE
PREFILLED_SYRINGE | INTRAVENOUS | Status: AC
  Filled 2023-08-13: qty 10

## 2023-08-13 MED ORDER — FENTANYL 2500MCG IN NS 250ML (10MCG/ML) PREMIX INFUSION
0.0000 ug/h | INTRAVENOUS | Status: DC
  Administered 2023-08-13: 50 ug/h via INTRAVENOUS
  Filled 2023-08-13: qty 250

## 2023-08-13 MED ORDER — ZOLPIDEM TARTRATE 5 MG PO TABS: 5.0000 mg | ORAL_TABLET | Freq: Every evening | ORAL | Status: DC | PRN

## 2023-08-13 MED ORDER — PROPOFOL 10 MG/ML IV BOLUS
INTRAVENOUS | Status: DC | PRN
  Administered 2023-08-13: 50 ug/kg/min via INTRAVENOUS
  Administered 2023-08-13: 120 mg via INTRAVENOUS

## 2023-08-13 MED ORDER — DIPHENHYDRAMINE HCL 50 MG/ML IJ SOLN
25.0000 mg | Freq: Three times a day (TID) | INTRAMUSCULAR | Status: DC
  Administered 2023-08-13 – 2023-08-14 (×2): 25 mg via INTRAVENOUS
  Filled 2023-08-13 (×2): qty 1

## 2023-08-13 MED ORDER — DIPHENHYDRAMINE HCL 50 MG/ML IJ SOLN
INTRAMUSCULAR | Status: DC | PRN
Start: 2023-08-13 — End: 2023-08-13
  Administered 2023-08-13 (×2): 25 mg via INTRAVENOUS

## 2023-08-13 MED ORDER — BACITRACIN ZINC 500 UNIT/GM EX OINT
TOPICAL_OINTMENT | CUTANEOUS | Status: AC
Start: 2023-08-13 — End: ?
  Filled 2023-08-13: qty 28.35

## 2023-08-13 MED ORDER — ORAL CARE MOUTH RINSE
15.0000 mL | OROMUCOSAL | Status: DC
  Administered 2023-08-13 – 2023-08-14 (×9): 15 mL via OROMUCOSAL

## 2023-08-13 MED ORDER — FAMOTIDINE IN NACL 20-0.9 MG/50ML-% IV SOLN: 20.0000 mg | Freq: Once | INTRAVENOUS | Status: DC

## 2023-08-13 SURGICAL SUPPLY — 43 items
BAG COUNTER SPONGE SURGICOUNT (BAG) ×1 IMPLANT
BAND RUBBER #18 3X1/16 STRL (MISCELLANEOUS) ×2 IMPLANT
BENZOIN TINCTURE PRP APPL 2/3 (GAUZE/BANDAGES/DRESSINGS) ×1 IMPLANT
BLADE CLIPPER SURG (BLADE) IMPLANT
BUR MATCHSTICK NEURO 3.0 LAGG (BURR) ×1 IMPLANT
BUR PRECISION FLUTE 6.0 (BURR) ×1 IMPLANT
CANISTER SUCTION 3000ML PPV (SUCTIONS) ×1 IMPLANT
DRAPE LAPAROTOMY 100X72X124 (DRAPES) ×1 IMPLANT
DRAPE MICROSCOPE SLANT 54X150 (MISCELLANEOUS) ×1 IMPLANT
DRAPE SURG 17X23 STRL (DRAPES) ×4 IMPLANT
DRSG OPSITE POSTOP 4X6 (GAUZE/BANDAGES/DRESSINGS) ×1 IMPLANT
ELECTRODE BLDE 4.0 EZ CLN MEGD (MISCELLANEOUS) ×1 IMPLANT
ELECTRODE REM PT RTRN 9FT ADLT (ELECTROSURGICAL) ×1 IMPLANT
GAUZE 4X4 16PLY ~~LOC~~+RFID DBL (SPONGE) IMPLANT
GAUZE SPONGE 4X4 12PLY STRL (GAUZE/BANDAGES/DRESSINGS) ×1 IMPLANT
GLOVE BIO SURGEON STRL SZ 6 (GLOVE) ×1 IMPLANT
GLOVE BIO SURGEON STRL SZ8 (GLOVE) ×1 IMPLANT
GLOVE BIO SURGEON STRL SZ8.5 (GLOVE) ×1 IMPLANT
GLOVE BIOGEL PI IND STRL 6.5 (GLOVE) ×1 IMPLANT
GLOVE EXAM NITRILE XL STR (GLOVE) IMPLANT
GOWN STRL REUS W/ TWL LRG LVL3 (GOWN DISPOSABLE) IMPLANT
GOWN STRL REUS W/ TWL XL LVL3 (GOWN DISPOSABLE) ×1 IMPLANT
GOWN STRL REUS W/TWL 2XL LVL3 (GOWN DISPOSABLE) IMPLANT
HEMOSTAT POWDER KIT SURGIFOAM (HEMOSTASIS) ×1 IMPLANT
KIT BASIN OR (CUSTOM PROCEDURE TRAY) ×1 IMPLANT
KIT TURNOVER KIT B (KITS) ×1 IMPLANT
NDL HYPO 22X1.5 SAFETY MO (MISCELLANEOUS) ×1 IMPLANT
NEEDLE HYPO 22X1.5 SAFETY MO (MISCELLANEOUS) ×1 IMPLANT
NS IRRIG 1000ML POUR BTL (IV SOLUTION) ×1 IMPLANT
PACK LAMINECTOMY NEURO (CUSTOM PROCEDURE TRAY) ×1 IMPLANT
PAD ARMBOARD POSITIONER FOAM (MISCELLANEOUS) ×3 IMPLANT
PATTIES SURGICAL .5 X.5 (GAUZE/BANDAGES/DRESSINGS) ×1 IMPLANT
PATTIES SURGICAL .5 X1 (DISPOSABLE) IMPLANT
PATTIES SURGICAL 1X1 (DISPOSABLE) ×1 IMPLANT
SPONGE SURGIFOAM ABS GEL SZ50 (HEMOSTASIS) IMPLANT
STRIP CLOSURE SKIN 1/2X4 (GAUZE/BANDAGES/DRESSINGS) ×1 IMPLANT
SUT VIC AB 1 CT1 18XBRD ANBCTR (SUTURE) ×1 IMPLANT
SUT VIC AB 2-0 CP2 18 (SUTURE) ×1 IMPLANT
SYR 30ML SLIP (SYRINGE) ×1 IMPLANT
TOWEL GREEN STERILE (TOWEL DISPOSABLE) ×1 IMPLANT
TOWEL GREEN STERILE FF (TOWEL DISPOSABLE) ×1 IMPLANT
TRAY FOLEY MTR SLVR 16FR STAT (SET/KITS/TRAYS/PACK) IMPLANT
WATER STERILE IRR 1000ML POUR (IV SOLUTION) ×1 IMPLANT

## 2023-08-13 NOTE — Progress Notes (Signed)
 Because of the patient's reaction to anesthesia/hypotension Dr. Lasalle Pointer and I thought it best to observe the patient in the ICU overnight.  I called the patient's daughter several times to update her but did not get an answer.

## 2023-08-13 NOTE — Progress Notes (Signed)
 Patient has been alternating between her 2 PRN pain medications which she states does not ease the pain enough to make her comfortable.  She has been using heat packs to help with the leg pain and cold packs to help with her headaches.

## 2023-08-13 NOTE — Anesthesia Preprocedure Evaluation (Addendum)
 Anesthesia Evaluation  Patient identified by MRN, date of birth, ID band Patient awake    Reviewed: Allergy & Precautions, NPO status , Patient's Chart, lab work & pertinent test results  History of Anesthesia Complications Negative for: history of anesthetic complications  Airway Mallampati: I  TM Distance: >3 FB Neck ROM: Full    Dental no notable dental hx. (+) Upper Dentures, Lower Dentures   Pulmonary neg pulmonary ROS   Pulmonary exam normal breath sounds clear to auscultation       Cardiovascular hypertension, Pt. on medications (-) angina (-) Past MI Normal cardiovascular exam Rhythm:Regular Rate:Normal     Neuro/Psych  Neuromuscular disease (L lumbar radiculopathy)    GI/Hepatic negative GI ROS, Neg liver ROS,,,  Endo/Other  diabetes, Well Controlled, Type 2, Oral Hypoglycemic Agents  Ozempic   Renal/GU Lab Results      Component                Value               Date                      NA                       138                 08/13/2023                CL                       107                 08/13/2023                K                        3.8                 08/13/2023                CO2                      19 (L)              08/13/2023                BUN                      26 (H)              08/13/2023                CREATININE               0.81                08/13/2023                GFRNONAA                 >60                 08/13/2023                CALCIUM                  9.1  08/13/2023                ALBUMIN                  4.6                 08/11/2023                GLUCOSE                  146 (H)             08/13/2023                Musculoskeletal negative musculoskeletal ROS (+)    Abdominal   Peds  Hematology Lab Results      Component                Value               Date                      WBC                      10.8 (H)             08/13/2023                HGB                      13.5                08/13/2023                HCT                      41.3                08/13/2023                MCV                      90.2                08/13/2023                PLT                      376                 08/13/2023              Anesthesia Other Findings   Reproductive/Obstetrics negative OB ROS                             Anesthesia Physical Anesthesia Plan  ASA: 3  Anesthesia Plan: General   Post-op Pain Management: Precedex and Tylenol  PO (pre-op)*   Induction: Intravenous  PONV Risk Score and Plan: 4 or greater and Treatment may vary due to age or medical condition, Ondansetron  and Midazolam  Airway Management Planned: Oral ETT  Additional Equipment: None  Intra-op Plan:   Post-operative Plan: Extubation in OR  Informed Consent: I have reviewed the patients History and Physical, chart, labs and discussed the procedure including the risks, benefits and alternatives for the proposed anesthesia with the patient or authorized representative who has indicated his/her understanding and acceptance.  Dental advisory given  Plan Discussed with: CRNA and Surgeon  Anesthesia Plan Comments:        Anesthesia Quick Evaluation

## 2023-08-13 NOTE — Plan of Care (Signed)
  Problem: Education: Goal: Ability to describe self-care measures that may prevent or decrease complications (Diabetes Survival Skills Education) will improve Outcome: Progressing Goal: Individualized Educational Video(s) Outcome: Progressing   Problem: Coping: Goal: Ability to adjust to condition or change in health will improve Outcome: Progressing   Problem: Fluid Volume: Goal: Ability to maintain a balanced intake and output will improve Outcome: Progressing   Problem: Health Behavior/Discharge Planning: Goal: Ability to identify and utilize available resources and services will improve Outcome: Progressing Goal: Ability to manage health-related needs will improve Outcome: Progressing   Problem: Metabolic: Goal: Ability to maintain appropriate glucose levels will improve Outcome: Progressing   Problem: Education: Goal: Ability to describe self-care measures that may prevent or decrease complications (Diabetes Survival Skills Education) will improve Outcome: Progressing Goal: Individualized Educational Video(s) Outcome: Progressing

## 2023-08-13 NOTE — Op Note (Signed)
 Brief history: The patient is a 73 year old white female who was complained of back and left anterior leg pain consistent with a lumbar radiculopathy.  She failed medical management and was worked up with a lumbar MRI which demonstrated a far lateral herniated disc at L3-4 on the left.  I discussed the various treatment options with her.  She has decided proceed with surgery.  Preoperative diagnosis: L3-4 far lateral herniated disc, lumbar radiculopathy  Postoperative diagnosis: The same  Procedure: Left L3-4 far lateral intervertebral discectomy using micro-dissection  Surgeon: Dr. Pleasant Brilliant  Asst.: None  Anesthesia: Gen. endotracheal  Estimated blood loss: Minimal  Drains: None  Complications: None  Description of procedure: The patient was brought to the operating room by the anesthesia team. General endotracheal anesthesia was induced. The patient was turned to the prone position on the Wilson frame. The patient's lumbosacral region was then prepared with Betadine scrub and Betadine solution. Sterile drapes were applied.  I then injected the area to be incised with Marcaine  with epinephrine solution. I then used a scalpel to make a linear midline incision over the L3-4 intervertebral disc space. I then used electrocautery to perform a left sided subperiosteal dissection exposing the spinous process and lamina of L3 and L4. We obtained intraoperative radiograph to confirm our location. I then inserted the [employer] retractor for exposure.  We then brought the operative microscope into the field. Under its magnification and illumination we completed the microdissection. I used a high-speed drill to drill away the lateral aspect of the left L3 pars.. I then used a Kerrison punches to remove the intertransverse ligament.. We then used microdissection to free up the thecal sac and the the L3 nerve root nerve root from the epidural tissue. I then used a Kerrison punch to perform a  foraminotomy at about the left L3 nerve root nerve root.  We dissected in the axilla of the nerve root and as expected we encountered a herniated disc compressing the exiting L3 nerve root.  We removed the ruptured disc with the pituitary forceps.  Inspected the intervertebral disc.  I did not see any impending herniations.  I then palpated along the ventral surface of the thecal sac and along exit route of the left L3 nerve root and noted that the neural structures were well decompressed. This completed the decompression.  We then obtained hemostasis using bipolar electrocautery. We irrigated the wound out with saline solution. We then removed the retractor. We then reapproximated the patient's thoracolumbar fascia with interrupted #1 Vicryl suture. We then reapproximated the patient's subcutaneous tissue with interrupted 2-0 Vicryl suture. We then reapproximated patient's skin with Steri-Strips and benzoin. The was then coated with bacitracin ointment. The drapes were removed. The patient was subsequently returned to the supine position where they were extubated by the anesthesia team. The patient was then transported to the postanesthesia care unit in stable condition. All sponge instrument and needle counts were reportedly correct at the end of this case.

## 2023-08-13 NOTE — Transfer of Care (Signed)
 Immediate Anesthesia Transfer of Care Note  Patient: Priscilla Adams  Procedure(s) Performed: LUMBAR LAMINECTOMY/DECOMPRESSION MICRODISCECTOMY LUMBAR THREE-LUMBAR FOUR  Patient Location: ICU  Anesthesia Type:General  Level of Consciousness: Patient remains intubated per anesthesia plan  Airway & Oxygen  Therapy: Patient remains intubated per anesthesia plan and Patient placed on Ventilator (see vital sign flow sheet for setting)  Post-op Assessment: Report given to RN and Post -op Vital signs reviewed and stable  Post vital signs: Reviewed and stable  Last Vitals:  Vitals Value Taken Time  BP 134/65 08/13/23 2000  Temp    Pulse 87 08/13/23 2001  Resp 18 08/13/23 2001  SpO2 100 % 08/13/23 2001  Vitals shown include unfiled device data.  Last Pain:  Vitals:   08/13/23 1510  TempSrc:   PainSc: 10-Worst pain ever         Complications: No notable events documented.

## 2023-08-13 NOTE — Anesthesia Procedure Notes (Signed)
 Procedure Name: Intubation Date/Time: 08/13/2023 4:42 PM  Performed by: Linard Reno, CRNAPre-anesthesia Checklist: Patient identified, Emergency Drugs available, Suction available and Patient being monitored Patient Re-evaluated:Patient Re-evaluated prior to induction Oxygen  Delivery Method: Circle System Utilized Preoxygenation: Pre-oxygenation with 100% oxygen  Induction Type: IV induction Ventilation: Mask ventilation without difficulty Laryngoscope Size: Mac and 3 Grade View: Grade I Tube type: Oral Tube size: 7.0 mm Number of attempts: 1 Airway Equipment and Method: Stylet and Oral airway Placement Confirmation: ETT inserted through vocal cords under direct vision, positive ETCO2 and breath sounds checked- equal and bilateral Secured at: 22 cm Tube secured with: Tape Dental Injury: Teeth and Oropharynx as per pre-operative assessment

## 2023-08-13 NOTE — Progress Notes (Signed)
 PROGRESS NOTE    Priscilla Adams  WGN:[id number] DOB: 1951/01/28 DOA: 08/11/2023 PCP: Veda Gerald, MD   Brief Narrative:  HPI: Priscilla Adams is a 73 y.o. female with medical history significant for type 2 diabetes mellitus and hypertension who presents with severe pain from her left hip down through the left knee.   Patient reports waking the morning of 08/09/2023 with severe pain involving her left leg.  There was no preceding trauma or inciting event that she can identify.  She describes severe pain at the left hip with radiation down the lateral and anterior aspect of the leg to the knee.   She was seen at another facility where she reports that x-rays were negative.  She has been using Lidoderm  but her symptoms have progressively worsened to the point where she is unable to ambulate or even get out of bed now.   Her chronic back pain has not changed much recently and she denies fever, chills, incontinence, or saddle anesthesia.   ED Course: Upon arrival to the ED, patient is found to be afebrile and saturating well on room air with normal HR and elevated BP.  Labs are most notable for creatinine 1.05, normal WBC, and normal CK.  Doppler study is negative for LLE DVT.  MRI reveals bilateral L4-L5 lateral recess stenosis, moderate right and severe left L5-S1 neuroforaminal stenosis due to disc bulge, endplate spurring, and severe facet arthrosis.   Patient was treated in the ED with a liter of NS, Lidoderm  patch, morphine , Dilaudid , Toradol , and 2 doses of Valium.  Neurosurgery (Dr. Larrie Po) was consulted by the ED physician.  Assessment & Plan:   Principal Problem:   Intractable pain Active Problems:   Diabetes mellitus without complication (HCC)   Hypertension   Lumbar radiculopathy  Acute on chronic low back pain due to lumbar radiculopathy: MRI with lumbar radiculopathy.  Seen by neurosurgery yesterday, initially she refused surgery but now she is amenable for the surgery and she  is scheduled to have discectomy today.  Type 2 diabetes mellitus: Hold oral antidiabetic agents.  Continue SSI.  Essential hypertension: Blood pressure fairly controlled.  Continue irbesartan.  Continue as needed hydralazine.  Hypokalemia: Resolved.  Hyperlipidemia: Continue atorvastatin.  DVT prophylaxis: SCDs Start: 08/11/23 2134   Code Status: Full Code  Family Communication:  None present at bedside.  Plan of care discussed with patient in length and he/she verbalized understanding and agreed with it.  Status is: Inpatient Remains inpatient appropriate because: Scheduled for surgery today.     Estimated body mass index is 31.32 kg/m as calculated from the following:   Height as of 08/09/23: 5\' 7"  (1.702 m).   Weight as of 08/09/23: 90.7 kg.    Nutritional Assessment: There is no height or weight on file to calculate BMI.. Seen by dietician.  I agree with the assessment and plan as outlined below: Nutrition Status:        . Skin Assessment: I have examined the patient's skin and I agree with the wound assessment as performed by the wound care RN as outlined below:    Consultants:  Neurosurgery  Procedures:  As above  Antimicrobials:  Anti-infectives (From admission, onward)    None         Subjective: Seen and examined, sister at the bedside.  Patient still complains of left leg pain but improved compared to yesterday.  She appears to be in better mood today.  No complaints.  She is aware of  the surgery planned for later today.  Objective: Vitals:   08/12/23 1600 08/12/23 1932 08/13/23 0520 08/13/23 0741  BP: (!) 154/73 (!) 150/70 (!) 143/79 (!) 148/83  Pulse: 95 85 93 88  Resp:  19 20 17   Temp: 97.9 F (36.6 C) 98.3 F (36.8 C) 97.6 F (36.4 C) 98.1 F (36.7 C)  TempSrc: Oral Oral Oral   SpO2: 98% 96% 96% 97%   No intake or output data in the 24 hours ending 08/13/23 1215 There were no vitals filed for this visit.  Examination:  General  exam: Appears calm and comfortable  Respiratory system: Clear to auscultation. Respiratory effort normal. Cardiovascular system: S1 & S2 heard, RRR. No JVD, murmurs, rubs, gallops or clicks. No pedal edema. Gastrointestinal system: Abdomen is nondistended, soft and nontender. No organomegaly or masses felt. Normal bowel sounds heard. Central nervous system: Alert and oriented. No focal neurological deficits. Extremities: Symmetric 5 x 5 power.  Positive straight leg raise test on the left side. Skin: No rashes, lesions or ulcers Psychiatry: Judgement and insight appear normal. Mood & affect appropriate.    Data Reviewed: I have personally reviewed following labs and imaging studies  CBC: Recent Labs  Lab 08/11/23 1614 08/12/23 0528 08/13/23 0558  WBC 9.3 9.2 10.8*  NEUTROABS 6.5  --   --   HGB 13.6 12.4 13.5  HCT 41.7 37.3 41.3  MCV 89.9 89.4 90.2  PLT 320 306 376   Basic Metabolic Panel: Recent Labs  Lab 08/11/23 1614 08/12/23 0528 08/13/23 0558  NA 138 138 138  K 3.8 3.4* 3.8  CL 104 106 107  CO2 18* 19* 19*  GLUCOSE 157* 163* 146*  BUN 25* 28* 26*  CREATININE 1.05* 0.94 0.81  CALCIUM 9.7 8.9 9.1   GFR: Estimated Creatinine Clearance: 70.4 mL/min (by C-G formula based on SCr of 0.81 mg/dL). Liver Function Tests: Recent Labs  Lab 08/11/23 1614  AST 33  ALT 16  ALKPHOS 79  BILITOT 1.2  PROT 8.4*  ALBUMIN 4.6   No results for input(s): "LIPASE", "AMYLASE" in the last 168 hours. No results for input(s): "AMMONIA" in the last 168 hours. Coagulation Profile: No results for input(s): "INR", "PROTIME" in the last 168 hours. Cardiac Enzymes: Recent Labs  Lab 08/11/23 1614  CKTOTAL 58   BNP (last 3 results) No results for input(s): "PROBNP" in the last 8760 hours. HbA1C: Recent Labs    08/12/23 0528  HGBA1C 6.8*   CBG: Recent Labs  Lab 08/12/23 2006 08/12/23 2356 08/13/23 0323 08/13/23 0805 08/13/23 1150  GLUCAP 134* 135* 132* 143* 153*   Lipid  Profile: No results for input(s): "CHOL", "HDL", "LDLCALC", "TRIG", "CHOLHDL", "LDLDIRECT" in the last 72 hours. Thyroid Function Tests: No results for input(s): "TSH", "T4TOTAL", "FREET4", "T3FREE", "THYROIDAB" in the last 72 hours. Anemia Panel: No results for input(s): "VITAMINB12", "FOLATE", "FERRITIN", "TIBC", "IRON", "RETICCTPCT" in the last 72 hours. Sepsis Labs: No results for input(s): "PROCALCITON", "LATICACIDVEN" in the last 168 hours.  No results found for this or any previous visit (from the past 240 hours).   Radiology Studies: VAS US  LOWER EXTREMITY VENOUS (DVT) (ONLY [facility] & [facility]) Result Date: 08/12/2023  Lower Venous DVT Study Patient Name:  PANSIE GUGGISBERG  Date of Exam:   08/11/2023 Medical Rec #: [id number]       Accession #:    [id number] Date of Birth: [birth year]       Patient Gender: F Patient Age:   89 years Exam Location:  [facility] Procedure:      VAS US  LOWER EXTREMITY VENOUS (DVT) Referring Phys: Paris Bolds --------------------------------------------------------------------------------  Indications: Pain.  Comparison Study: Prior negative left LEV done 02/07/19 at [facility] Performing Technologist: Carleene Chase RVS  Examination Guidelines: A complete evaluation includes B-mode imaging, spectral Doppler, color Doppler, and power Doppler as needed of all accessible portions of each vessel. Bilateral testing is considered an integral part of a complete examination. Limited examinations for reoccurring indications may be performed as noted. The reflux portion of the exam is performed with the patient in reverse Trendelenburg.  +-----+---------------+---------+-----------+----------+--------------+ RIGHTCompressibilityPhasicitySpontaneityPropertiesThrombus Aging +-----+---------------+---------+-----------+----------+--------------+ CFV  Full           Yes      Yes                                  +-----+---------------+---------+-----------+----------+--------------+ SFJ  Full                                                        +-----+---------------+---------+-----------+----------+--------------+   +---------+---------------+---------+-----------+----------+--------------+ LEFT     CompressibilityPhasicitySpontaneityPropertiesThrombus Aging +---------+---------------+---------+-----------+----------+--------------+ CFV      Full           Yes      Yes                                 +---------+---------------+---------+-----------+----------+--------------+ SFJ      Full                                                        +---------+---------------+---------+-----------+----------+--------------+ FV Prox  Full                                                        +---------+---------------+---------+-----------+----------+--------------+ FV Mid   Full                                                        +---------+---------------+---------+-----------+----------+--------------+ FV DistalFull                                                        +---------+---------------+---------+-----------+----------+--------------+ PFV      Full                                                        +---------+---------------+---------+-----------+----------+--------------+ POP      Full  Yes      Yes                                 +---------+---------------+---------+-----------+----------+--------------+ PTV      Full                                                        +---------+---------------+---------+-----------+----------+--------------+ PERO     Full                                                        +---------+---------------+---------+-----------+----------+--------------+     Summary: RIGHT: - No evidence of common femoral vein obstruction.   LEFT: - There is no evidence of deep vein thrombosis in the  lower extremity.  - A cystic structure is found in the popliteal fossa measuring 1.05 cm x 2.25 cm.  *See table(s) above for measurements and observations. Electronically signed by Delaney Fearing on 08/12/2023 at 7:39:20 AM.    Final    MR Lumbar Spine W Wo Contrast Result Date: 08/11/2023 CLINICAL DATA:  Low back pain with difficulty walking EXAM: MRI LUMBAR SPINE WITHOUT AND WITH CONTRAST TECHNIQUE: Multiplanar and multiecho pulse sequences of the lumbar spine were obtained without and with intravenous contrast. CONTRAST:  9mL GADAVIST GADOBUTROL 1 MMOL/ML IV SOLN COMPARISON:  None Available. FINDINGS: Segmentation:  Standard. Alignment:  Physiologic. Vertebrae:  No fracture, evidence of discitis, or bone lesion. Conus medullaris and cauda equina: Conus extends to the L1 level. Conus and cauda equina appear normal. Paraspinal and other soft tissues: Negative. Disc levels: L1-L2: Small central disc protrusion and mild facet hypertrophy. No spinal canal stenosis. No neural foraminal stenosis. L2-L3: Normal disc space and facet joints. No spinal canal stenosis. No neural foraminal stenosis. L3-L4: Normal disc space and facet joints. No spinal canal stenosis. No neural foraminal stenosis. L4-L5: Small disc bulge with moderate facet hypertrophy. Narrowing of both lateral recesses without central spinal canal stenosis. No neural foraminal stenosis. L5-S1: Small disc bulge with endplate spurring and severe facet hypertrophy. No spinal canal stenosis. Moderate right and severe left neural foraminal stenosis. Visualized sacrum: Normal. IMPRESSION: 1. Moderate right and severe left L5-S1 neural foraminal stenosis due to combination of disc bulge, endplate spurring and severe facet arthrosis. 2. Bilateral lateral recess stenosis at L4-L5, which may serve as a source of L5 radiculopathy. Electronically Signed   By: Juanetta Nordmann M.D.   On: 08/11/2023 20:44   CT Lumbar Spine Wo Contrast Result Date: 08/11/2023 EXAM: CT OF THE  LUMBAR SPINE WITHOUT CONTRAST 08/11/2023 06:05:20 PM TECHNIQUE: CT of the lumbar spine was performed without the administration of intravenous contrast. Multiplanar reformatted images are provided for review. Automated exposure control, iterative reconstruction, and/or weight based adjustment of the mA/kV was utilized to reduce the radiation dose to as low as reasonably achievable. COMPARISON: None available. CLINICAL HISTORY: Low back pain, increased fracture risk. Was seen at [facility] on 08/09/23 and provided gabapentin, lidocaine  patch. 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 FINDINGS: BONES AND ALIGNMENT: Straightening  of the normal lumbar lordosis is present. No significant listhesis is present. Mild leftward curvature is present in the mid lumbar spine. Degenerative changes are present at the SI joints bilaterally. DEGENERATIVE CHANGES: T12-L1: Asymmetric left-sided facet hypertrophy results in mild left foraminal stenosis. A broad-based disc protrusion and moderate facet hypertrophy are present bilaterally at L1-2. Mild central canal stenosis is present. Moderate right and mild left foraminal narrowing is present. L2-3: Rightward disc bulge is present. Facet hypertrophy is worse on the right. No focal stenosis is present. L3-4: A broad-based disc protrusion is present. Facet hypertrophy is worse on the right. Mild right subarticular narrowing is present. A leftward disc protrusion extends into the foramen with moderate left foraminal stenosis. L4-5: A broad-based disc protrusion is present. Moderate facet hypertrophy is noted bilaterally. This results in moderate-to-severe central canal stenosis. Moderate foraminal narrowing is worse left than right. L5-S1: Advanced facet hypertrophy is present. Endplate spurs are present. Moderate-to-severe subarticular stenosis is present bilaterally. Severe left and moderate right foraminal  stenosis is present. SOFT TISSUES: No paraspinal hematoma. LIMITED RETROPERITONEUM: Limited images of the retroperitoneum demonstrate no acute abnormality. Atherosclerotic changes are present in the aorta without aneurysm. Visualized abdomen is otherwise unremarkable. IMPRESSION: 1. Severe degenerative changes at L5-S1 with advanced facet hypertrophy, moderate-to-severe subarticular stenosis bilaterally, and severe left/moderate right foraminal stenosis. 2. Moderate-to-severe central canal stenosis at L4-5 with moderate facet hypertrophy and moderate foraminal narrowing, worse on the left. 3. Left foraminal stenosis at L3-4. 4. Mild central canal stenosis at L1-2 with moderate facet hypertrophy and moderate right/mild left foraminal narrowing. Electronically signed by: Audree Leas MD 08/11/2023 06:21 PM EDT RP Workstation: [id number]   CT FEMUR LEFT WO CONTRAST Result Date: 08/11/2023 CLINICAL DATA:  Osteomyelitis suspected, femur, xray done L femur rod and now has L leg swelling and pain r/o fracture vs hardware migration EXAM: CT OF THE LOWER LEFT EXTREMITY WITHOUT CONTRAST TECHNIQUE: Multidetector CT imaging of the lower left extremity was performed according to the standard protocol. RADIATION DOSE REDUCTION: This exam was performed according to the departmental dose-optimization program which includes automated exposure control, adjustment of the mA and/or kV according to patient size and/or use of iterative reconstruction technique. COMPARISON:  None Available. FINDINGS: Bones/Joint/Cartilage Left intramedullary rod in place in the left femur across a healed left femoral midshaft fracture. Without prior imaging, is difficult to exclude migration, but no visible obvious hardware complicating feature. No bone destruction to suggest osteomyelitis. Ligaments Suboptimally assessed by CT. Muscles and Tendons Negative Soft tissues Negative IMPRESSION: Left femoral intramedullary rod in place across a healed  mid left femoral fracture. No obvious hardware complicating feature. No acute bony abnormality. Electronically Signed   By: Janeece Mechanic M.D.   On: 08/11/2023 18:13    Scheduled Meds:  atorvastatin  40 mg Oral Daily   gabapentin  100 mg Oral TID   insulin aspart  0-6 Units Subcutaneous Q4H   irbesartan  150 mg Oral Daily   lidocaine   1 patch Transdermal Q24H   trimethoprim-polymyxin b  1 drop Left Eye Q6H   Continuous Infusions:   LOS: 0 days   Modena Andes, MD [facility]  08/13/2023, 12:15 PM   *Please note that this is a verbal dictation therefore any spelling or grammatical errors are due to the "[employer]agon [employer] One" system interpretation.  Please page via Amion and do not message via secure chat for urgent patient care matters. Secure chat can be used for non urgent patient care matters.  How to  contact the [facility] Attending or Consulting provider 7A - 7P or covering provider during after hours 7P -7A, for this patient?  Check the care team in [facility] and look for a) attending/consulting [facility] provider listed and b) the [facility] team listed. Page or secure chat 7A-7P. Log into [email or web address] and use [place]'s universal password to access. If you do not have the password, please contact the hospital operator. Locate the [facility] provider you are looking for under [facility] and page to a number that you can be directly reached. If you still have difficulty reaching the provider, please page the [facility] (Director on Call) for the Hospitalists listed on amion for assistance.

## 2023-08-13 NOTE — Progress Notes (Signed)
 PHARMACY ANTIBIOTIC CONSULT NOTE   Priscilla Adams a 73 y.o. female s/p lumbar laminectomy/decompression microdiscectomy.  Pharmacy has been consulted for Vancomycin dosing for post-op surgical ppx- no drain in place, therefore only indicated for one dose per consult  Scr 0.81 (08/13/2023), WBC 10.8 (08/13/2023)   Estimated Creatinine Clearance: 71.5 mL/min (by C-G formula based on SCr of 0.81 mg/dL).  Plan: Vancomycin 1g IV x1 - due 6/10 0800  Will sign off consult at this time   Allergies:  Allergies  Allergen Reactions   Contrast Media [Iodinated Contrast Media] Shortness Of Breath    Patient reports she has had a reaction x40 years ago   Penicillins Anaphylaxis and Rash    Told that her mouth was swollen and she couldn't breath and told by MD that she shouldn't take it again.   Aspirin     "Burns stomach"    Filed Weights   08/13/23 1454  Weight: 89.8 kg (198 lb)       Latest Ref Rng & Units 08/13/2023    5:58 AM 08/12/2023    5:28 AM 08/11/2023    4:14 PM  CBC  WBC 4.0 - 10.5 K/uL 10.8  9.2  9.3   Hemoglobin 12.0 - 15.0 g/dL 57.8  46.9  62.9   Hematocrit 36.0 - 46.0 % 41.3  37.3  41.7   Platelets 150 - 400 K/uL 376  306  320     Antibiotics Given (last 72 hours)     None       Chrystie Crass, PharmD Clinical Pharmacist  08/13/2023 8:18 PM

## 2023-08-13 NOTE — Anesthesia Postprocedure Evaluation (Signed)
 Anesthesia Post Note  Patient: Priscilla Adams  Procedure(s) Performed: LUMBAR LAMINECTOMY/DECOMPRESSION MICRODISCECTOMY LUMBAR THREE-LUMBAR FOUR     Patient location during evaluation: SICU Anesthesia Type: General Level of consciousness: sedated Pain management: pain level controlled Vital Signs Assessment: post-procedure vital signs reviewed and stable Respiratory status: patient remains intubated per anesthesia plan Cardiovascular status: stable Postop Assessment: no apparent nausea or vomiting Anesthetic complications: no  No notable events documented.  Last Vitals:  Vitals:   08/13/23 1454 08/13/23 2000  BP: (!) 156/66   Pulse: 90   Resp: 18   Temp: 37.2 C (!) 35.9 C  SpO2: 98% 100%    Last Pain:  Vitals:   08/13/23 2000  TempSrc: Axillary  PainSc:                  Willian Harrow

## 2023-08-13 NOTE — Progress Notes (Signed)
 eLink Physician-Brief Progress Note Patient Name: Priscilla Adams DOB: Jul 05, 1950 MRN: [id number]   Date of Service  08/13/2023  HPI/Events of Note  eICU Brief new admit note: 73 year old white female  with hx of  HTN, DM 2 who was complained of back and left anterior leg pain consistent with a lumbar radiculopathy. She failed medical management and was worked up with a lumbar MRI which demonstrated a far lateral herniated disc at L3-4 on the left . Now in ICU post op- micro dissection of left L3-4 , on Ventilator.   Data: Reviewed Glucose 188. Pre op labs reviewed is fine. Wbc 10.8  Camera: In synchrony with Vent 500( < 8 ml/ibw)/07/22/58%.  HR 92. Sats 100%, art line SBP > 160.   eICU Interventions  On SSI, goals < 180 Lung protective ventilation, daily SAT/SBG weaning trials as tolerated per protocol Keep MAP > 65 SCD VAP bundle. Pain control Follow labs.      Intervention Category Major Interventions: Respiratory failure - evaluation and management;Other: Intermediate Interventions: Pain - evaluation and management Evaluation Type: New Patient Evaluation  Rexann Catalan 08/13/2023, 8:08 PM

## 2023-08-13 NOTE — Progress Notes (Signed)
 Patient admitted to 4N ICU with no enteric tube in place. Spoke with CCM MD, Charon Copper, about placement of an orogastric tube for the couple of medications she has ordered for tonight. MD expressed no need for an enteric tube as the patient will be weaned from sedation and ventilatory support, then extubated in the early AM. Will chart not given r/t NPO status on ordered PO medications overnight tonight.   Iverna Hammac C. Thirza Fleet BSN, RN Neuro/Trauma/Surgical ICU 08/13/2023 9:35 PM

## 2023-08-13 NOTE — Anesthesia Procedure Notes (Addendum)
 Arterial Line Insertion Start/End6/11/2023 5:02 PM, 08/13/2023 5:03 PM Performed by: Robert Chimes, CRNA, CRNA  Preanesthetic checklist: patient identified, IV checked, site marked, risks and benefits discussed, surgical consent, monitors and equipment checked, pre-op evaluation, timeout performed and anesthesia consent Patient sedated Right, radial was placed Hand hygiene performed  and maximum sterile barriers used  Allen's test indicative of satisfactory collateral circulation Attempts: 1 Procedure performed without using ultrasound guided technique. Ultrasound Notes:anatomy identified and needle tip was noted to be adjacent to the nerve/plexus identified Following insertion, Biopatch and dressing applied. Post procedure assessment: normal and unchanged  Patient tolerated the procedure well with no immediate complications.

## 2023-08-13 NOTE — Evaluation (Signed)
 Physical Therapy Evaluation Patient Details Name: Priscilla Adams MRN: [id number] DOB: [birth year] Today's Date: 08/13/2023  History of Present Illness  Pt is 73 yo female who presents on 08/11/23 with severe pain from L hip down through knee.  MRI reveals bilateral L4-L5 lateral recess stenosis, moderate right and severe left L5-S1 neuroforaminal stenosis due to disc bulge, endplate spurring, and severe facet arthrosis. Plan is for discectomy 08/13/23.  PMH: DM2, HTN  Clinical Impression  Pt admitted with above diagnosis. Pt reports independence and living alone with daughter nearby until last Thursday when she could not stand up because of back and LLE pain. Pt continues to have pain from L hip to L knee and is avoidant of full WB'ing on LLE. Pt educated on back precautions and what to expect after surgery. Pt able to ambulate within room with RW. Recommend HHPT after surgery.  Pt currently with functional limitations due to the deficits listed below (see PT Problem List). Pt will benefit from acute skilled PT to increase their independence and safety with mobility to allow discharge.           If plan is discharge home, recommend the following: A little help with walking and/or transfers;A little help with bathing/dressing/bathroom;Assistance with cooking/housework;Assist for transportation;Help with stairs or ramp for entrance   Can travel by private vehicle        Equipment Recommendations None recommended by PT  Recommendations for Other Services  OT consult    Functional Status Assessment Patient has had a recent decline in their functional status and demonstrates the ability to make significant improvements in function in a reasonable and predictable amount of time.     Precautions / Restrictions Precautions Precautions: Back Precaution Booklet Issued: Yes (comment) Recall of Precautions/Restrictions: Impaired Precaution/Restrictions Comments: needs reminding of  precautions Restrictions Weight Bearing Restrictions Per Provider Order: No      Mobility  Bed Mobility Overal bed mobility: Needs Assistance Bed Mobility: Rolling, Sidelying to Sit, Sit to Supine Rolling: Supervision Sidelying to sit: Supervision   Sit to supine: Supervision   General bed mobility comments: vc's for log rolling and sequencing to keep precautions    Transfers Overall transfer level: Needs assistance Equipment used: Rolling walker (2 wheels) Transfers: Sit to/from Stand Sit to Stand: Contact guard assist           General transfer comment: min guard for safety. Pt keeping wt on RLE    Ambulation/Gait Ambulation/Gait assistance: Contact guard assist Gait Distance (Feet): 20 Feet Assistive device: Rolling walker (2 wheels) Gait Pattern/deviations: Step-to pattern Gait velocity: decreased Gait velocity interpretation: <1.31 ft/sec, indicative of household ambulator   General Gait Details: pt taking wt off LLE by pushing on RW. Pt feeling dizzy after 20' (NPO for sx)  Stairs            Wheelchair Mobility     Tilt Bed    Modified Rankin (Stroke Patients Only)       Balance Overall balance assessment: Needs assistance Sitting-balance support: No upper extremity supported, Feet supported Sitting balance-Leahy Scale: Good     Standing balance support: Bilateral upper extremity supported, During functional activity Standing balance-Leahy Scale: Poor Standing balance comment: due to pain                             Pertinent Vitals/Pain Pain Assessment Pain Assessment: Faces Faces Pain Scale: Hurts whole lot Pain Location: back, L hip to L knee  Pain Descriptors / Indicators: Aching, Shooting Pain Intervention(s): Limited activity within patient's tolerance, Monitored during session    Home Living Family/patient expects to be discharged to:: Private residence Living Arrangements: Alone Available Help at Discharge:  Family;Available PRN/intermittently Type of Home: House Home Access: Stairs to enter   Entrance Stairs-Number of Steps: 1   Home Layout: One level Home Equipment: Tub bench;Standard Walker;BSC/3in1 Additional Comments: daughter lives 2 doors down but goes to work at [employer]    Prior Function Prior Level of Function : Independent/Modified Independent             Mobility Comments: didn't need RW until past week       Extremity/Trunk Assessment   Upper Extremity Assessment Upper Extremity Assessment: Overall WFL for tasks assessed    Lower Extremity Assessment Lower Extremity Assessment: LLE deficits/detail LLE Deficits / Details: pain with knee extension, hesitant to bear full wt on LLE in standing. Not further tested due to pain LLE: Unable to fully assess due to pain LLE Sensation: WNL LLE Coordination: WNL    Cervical / Trunk Assessment Cervical / Trunk Assessment: Normal  Communication   Communication Communication: No apparent difficulties    Cognition Arousal: Alert Behavior During Therapy: WFL for tasks assessed/performed   PT - Cognitive impairments: Memory                       PT - Cognition Comments: reports that she needs to be taught several times before she can remember Following commands: Intact       Cueing Cueing Techniques: Verbal cues     General Comments General comments (skin integrity, edema, etc.): VSS. Precautions and proper posture as well as activity modifications reviewed    Exercises     Assessment/Plan    PT Assessment Patient needs continued PT services  PT Problem List Decreased strength;Decreased activity tolerance;Decreased balance;Decreased mobility;Pain;Decreased knowledge of precautions;Decreased knowledge of use of DME       PT Treatment Interventions DME instruction;Gait training;Stair training;Functional mobility training;Therapeutic activities;Therapeutic exercise;Balance training;Patient/family education     PT Goals (Current goals can be found in the Care Plan section)  Acute Rehab PT Goals Patient Stated Goal: return home, decreased pain PT Goal Formulation: With patient Time For Goal Achievement: 08/27/23 Potential to Achieve Goals: Good    Frequency Min 2X/week     Co-evaluation               AM-PAC PT "6 Clicks" Mobility  Outcome Measure Help needed turning from your back to your side while in a flat bed without using bedrails?: A Little Help needed moving from lying on your back to sitting on the side of a flat bed without using bedrails?: A Little Help needed moving to and from a bed to a chair (including a wheelchair)?: A Little Help needed standing up from a chair using your arms (e.g., wheelchair or bedside chair)?: A Little Help needed to walk in hospital room?: A Little Help needed climbing 3-5 steps with a railing? : A Lot 6 Click Score: 17    End of Session   Activity Tolerance: Patient limited by pain Patient left: in bed;with call bell/phone within reach;with bed alarm set Nurse Communication: Mobility status PT Visit Diagnosis: Pain;Difficulty in walking, not elsewhere classified (R26.2) Pain - Right/Left: Left Pain - part of body: Leg    Time: 6962-9528 PT Time Calculation (min) (ACUTE ONLY): 23 min   Charges:   PT Evaluation $PT Eval Moderate Complexity:  1 Mod PT Treatments $Gait Training: 8-22 mins PT General Charges $$ ACUTE PT VISIT: 1 Visit         Amey Ka, PT  Acute Rehab Services Secure chat preferred Office [phone]   Deloris Fetters Exa Bomba 08/13/2023, 11:37 AM

## 2023-08-13 NOTE — Progress Notes (Addendum)
 Subjective: The patient is alert and pleasant.  She complains of worsening left leg pain radiating anteriorly down to her knee.  Objective: Vital signs in last 24 hours: Temp:  [97.6 F (36.4 C)-98.6 F (37 C)] 98.1 F (36.7 C) (06/09 0741) Pulse Rate:  [84-95] 88 (06/09 0741) Resp:  [12-20] 17 (06/09 0741) BP: (125-154)/(53-83) 148/83 (06/09 0741) SpO2:  [96 %-99 %] 97 % (06/09 0741) Estimated body mass index is 31.32 kg/m as calculated from the following:   Height as of 08/09/23: 5\' 7"  (1.702 m).   Weight as of 08/09/23: 90.7 kg.   Intake/Output from previous day: No intake/output data recorded. Intake/Output this shift: No intake/output data recorded.  Physical exam the patient is alert and oriented.  She is moving her lower extremities well.  Lab Results: Recent Labs    08/12/23 0528 08/13/23 0558  WBC 9.2 10.8*  HGB 12.4 13.5  HCT 37.3 41.3  PLT 306 376   BMET Recent Labs    08/11/23 1614 08/12/23 0528  NA 138 138  K 3.8 3.4*  CL 104 106  CO2 18* 19*  GLUCOSE 157* 163*  BUN 25* 28*  CREATININE 1.05* 0.94  CALCIUM 9.7 8.9    Studies/Results: VAS US  LOWER EXTREMITY VENOUS (DVT) (ONLY [facility] & [facility]) Result Date: 08/12/2023  Lower Venous DVT Study Patient Name:  Priscilla Adams  Date of Exam:   08/11/2023 Medical Rec #: [id number]       Accession #:    [id number] Date of Birth: 29-Oct-1950       Patient Gender: F Patient Age:   73 years Exam Location:  [facility] Procedure:      VAS US  LOWER EXTREMITY VENOUS (DVT) Referring Phys: Paris Bolds --------------------------------------------------------------------------------  Indications: Pain.  Comparison Study: Prior negative left LEV done 02/07/19 at [facility] Performing Technologist: Carleene Chase RVS  Examination Guidelines: A complete evaluation includes B-mode imaging, spectral Doppler, color Doppler, and power Doppler as needed of all accessible portions of each vessel. Bilateral testing is considered an integral  part of a complete examination. Limited examinations for reoccurring indications may be performed as noted. The reflux portion of the exam is performed with the patient in reverse Trendelenburg.  +-----+---------------+---------+-----------+----------+--------------+ RIGHTCompressibilityPhasicitySpontaneityPropertiesThrombus Aging +-----+---------------+---------+-----------+----------+--------------+ CFV  Full           Yes      Yes                                 +-----+---------------+---------+-----------+----------+--------------+ SFJ  Full                                                        +-----+---------------+---------+-----------+----------+--------------+   +---------+---------------+---------+-----------+----------+--------------+ LEFT     CompressibilityPhasicitySpontaneityPropertiesThrombus Aging +---------+---------------+---------+-----------+----------+--------------+ CFV      Full           Yes      Yes                                 +---------+---------------+---------+-----------+----------+--------------+ SFJ      Full                                                        +---------+---------------+---------+-----------+----------+--------------+  FV Prox  Full                                                        +---------+---------------+---------+-----------+----------+--------------+ FV Mid   Full                                                        +---------+---------------+---------+-----------+----------+--------------+ FV DistalFull                                                        +---------+---------------+---------+-----------+----------+--------------+ PFV      Full                                                        +---------+---------------+---------+-----------+----------+--------------+ POP      Full           Yes      Yes                                  +---------+---------------+---------+-----------+----------+--------------+ PTV      Full                                                        +---------+---------------+---------+-----------+----------+--------------+ PERO     Full                                                        +---------+---------------+---------+-----------+----------+--------------+     Summary: RIGHT: - No evidence of common femoral vein obstruction.   LEFT: - There is no evidence of deep vein thrombosis in the lower extremity.  - A cystic structure is found in the popliteal fossa measuring 1.05 cm x 2.25 cm.  *See table(s) above for measurements and observations. Electronically signed by Delaney Fearing on 08/12/2023 at 7:39:20 AM.    Final    MR Lumbar Spine W Wo Contrast Result Date: 08/11/2023 CLINICAL DATA:  Low back pain with difficulty walking EXAM: MRI LUMBAR SPINE WITHOUT AND WITH CONTRAST TECHNIQUE: Multiplanar and multiecho pulse sequences of the lumbar spine were obtained without and with intravenous contrast. CONTRAST:  9mL GADAVIST GADOBUTROL 1 MMOL/ML IV SOLN COMPARISON:  None Available. FINDINGS: Segmentation:  Standard. Alignment:  Physiologic. Vertebrae:  No fracture, evidence of discitis, or bone lesion. Conus medullaris and cauda equina: Conus extends to the L1 level. Conus and cauda equina appear normal. Paraspinal and other soft tissues: Negative. Disc levels: L1-L2: Small central disc protrusion  and mild facet hypertrophy. No spinal canal stenosis. No neural foraminal stenosis. L2-L3: Normal disc space and facet joints. No spinal canal stenosis. No neural foraminal stenosis. L3-L4: Normal disc space and facet joints. No spinal canal stenosis. No neural foraminal stenosis. L4-L5: Small disc bulge with moderate facet hypertrophy. Narrowing of both lateral recesses without central spinal canal stenosis. No neural foraminal stenosis. L5-S1: Small disc bulge with endplate spurring and severe facet  hypertrophy. No spinal canal stenosis. Moderate right and severe left neural foraminal stenosis. Visualized sacrum: Normal. IMPRESSION: 1. Moderate right and severe left L5-S1 neural foraminal stenosis due to combination of disc bulge, endplate spurring and severe facet arthrosis. 2. Bilateral lateral recess stenosis at L4-L5, which may serve as a source of L5 radiculopathy. Electronically Signed   By: Juanetta Nordmann M.D.   On: 08/11/2023 20:44   CT Lumbar Spine Wo Contrast Result Date: 08/11/2023 EXAM: CT OF THE LUMBAR SPINE WITHOUT CONTRAST 08/11/2023 06:05:20 PM TECHNIQUE: CT of the lumbar spine was performed without the administration of intravenous contrast. Multiplanar reformatted images are provided for review. Automated exposure control, iterative reconstruction, and/or weight based adjustment of the mA/kV was utilized to reduce the radiation dose to as low as reasonably achievable. COMPARISON: None available. CLINICAL HISTORY: Low back pain, increased fracture risk. Was seen at [facility] on 08/09/23 and provided gabapentin, lidocaine  patch. 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 FINDINGS: BONES AND ALIGNMENT: Straightening of the normal lumbar lordosis is present. No significant listhesis is present. Mild leftward curvature is present in the mid lumbar spine. Degenerative changes are present at the SI joints bilaterally. DEGENERATIVE CHANGES: T12-L1: Asymmetric left-sided facet hypertrophy results in mild left foraminal stenosis. A broad-based disc protrusion and moderate facet hypertrophy are present bilaterally at L1-2. Mild central canal stenosis is present. Moderate right and mild left foraminal narrowing is present. L2-3: Rightward disc bulge is present. Facet hypertrophy is worse on the right. No focal stenosis is present. L3-4: A broad-based disc protrusion is present. Facet hypertrophy is worse on the right. Mild right  subarticular narrowing is present. A leftward disc protrusion extends into the foramen with moderate left foraminal stenosis. L4-5: A broad-based disc protrusion is present. Moderate facet hypertrophy is noted bilaterally. This results in moderate-to-severe central canal stenosis. Moderate foraminal narrowing is worse left than right. L5-S1: Advanced facet hypertrophy is present. Endplate spurs are present. Moderate-to-severe subarticular stenosis is present bilaterally. Severe left and moderate right foraminal stenosis is present. SOFT TISSUES: No paraspinal hematoma. LIMITED RETROPERITONEUM: Limited images of the retroperitoneum demonstrate no acute abnormality. Atherosclerotic changes are present in the aorta without aneurysm. Visualized abdomen is otherwise unremarkable. IMPRESSION: 1. Severe degenerative changes at L5-S1 with advanced facet hypertrophy, moderate-to-severe subarticular stenosis bilaterally, and severe left/moderate right foraminal stenosis. 2. Moderate-to-severe central canal stenosis at L4-5 with moderate facet hypertrophy and moderate foraminal narrowing, worse on the left. 3. Left foraminal stenosis at L3-4. 4. Mild central canal stenosis at L1-2 with moderate facet hypertrophy and moderate right/mild left foraminal narrowing. Electronically signed by: Audree Leas MD 08/11/2023 06:21 PM EDT RP Workstation: [id number]   CT FEMUR LEFT WO CONTRAST Result Date: 08/11/2023 CLINICAL DATA:  Osteomyelitis suspected, femur, xray done L femur rod and now has L leg swelling and pain r/o fracture vs hardware migration EXAM: CT OF THE LOWER LEFT EXTREMITY WITHOUT CONTRAST TECHNIQUE: Multidetector CT imaging of the lower left extremity was performed according to the standard  protocol. RADIATION DOSE REDUCTION: This exam was performed according to the departmental dose-optimization program which includes automated exposure control, adjustment of the mA and/or kV according to patient size and/or  use of iterative reconstruction technique. COMPARISON:  None Available. FINDINGS: Bones/Joint/Cartilage Left intramedullary rod in place in the left femur across a healed left femoral midshaft fracture. Without prior imaging, is difficult to exclude migration, but no visible obvious hardware complicating feature. No bone destruction to suggest osteomyelitis. Ligaments Suboptimally assessed by CT. Muscles and Tendons Negative Soft tissues Negative IMPRESSION: Left femoral intramedullary rod in place across a healed mid left femoral fracture. No obvious hardware complicating feature. No acute bony abnormality. Electronically Signed   By: Janeece Mechanic M.D.   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  We discussed the various treatment options including continued medical management, injections, therapy, and surgery.  I have answered all her questions regarding surgery.  She wants to proceed with a left L3-4 discectomy.  The plan is to do it this afternoon.  Hypokalemia: A repeat BMP is pending.   LOS: 0 days     Elder Greening 08/13/2023, 8:06 AM     Patient ID: Priscilla Adams, female   DOB: 1950/09/09, 73 y.o.   MRN: [id number]

## 2023-08-13 NOTE — Consult Note (Signed)
 NAME:  Priscilla Adams, MRN:  [id number], DOB:  01/07/1951, LOS: 0 ADMISSION DATE:  08/11/2023, CONSULTATION DATE:  08/13/23 REFERRING MD:  neurosurgery, CHIEF COMPLAINT:  post operative medical management   History of Present Illness:  73 yo female presented with severe L hip pain down through the L knee. Pt was recently seen at a separate facility a few days ago with negative xrays. She endorsed that the pain was sudden in onset and was located on the Left side only, mainly anterior aspect. She denies trauma or precipitating event. She was prescribed lidoderm  patched yet her sx continued to worsen to where yesterday she was unable to get out of bed or ambulate. She denies incontinence, fever, chills, numbness. She was evaluated for dvt (negative), normal CK, imaging with MRI revealed degenerative changes as well as L3-L4 Left lateral herniated disc.  Neurosurgery was consulted and recommended discectomy which she underwent today. Post operatively she was noted to be hypotensive with presumed reaction to induction medications. Anesthesia and neurosurgery have recommended close monitoring in ICU overnight.   Ccm was consulted for medical management post operatively.    Pertinent  Medical History  Htn Hyperlipidemia t2dm  Significant Hospital Events: Including procedures, antibiotic start and stop dates in addition to other pertinent events   Admitted to hospital 6/8 L3-4 left discectomy, 6/9, transferred to ICU  Interim History / Subjective:    Objective    Blood pressure (!) 156/66, pulse 90, temperature 98.9 F (37.2 C), temperature source Oral, resp. rate 18, height 5\' 7"  (1.702 m), weight 89.8 kg, SpO2 98%.        Intake/Output Summary (Last 24 hours) at 08/13/2023 1953 Last data filed at 08/13/2023 1743 Gross per 24 hour  Intake 500 ml  Output --  Net 500 ml   Filed Weights   08/13/23 1454  Weight: 89.8 kg    Examination: General: nad, sedated and intubated HENT: ncat,  perrla, sclera an icteric  Lungs: ctab Cardiovascular: rrr Abdomen: soft nt/nd bs + Extremities: no c/c/e Neuro: unable to assess in light of sedation GU: deferred  Resolved problem list   Assessment and Plan  S/p L3-4 left discectomy Chronic pain  Left le radiculopathy Presumed anaphylactic reaction to induction agent Acute post operative hypoxic resp insufficiency  H/o htn T2dm without hyperglycemia hyperlipidemia -post operative care per neurosurgery -pain control per neurosurgery -cont ssi -PT/OT when able -monitor BP closely cont oral medications and prn's -cont statin when able to take po -titrate vent. Will maintain intubation tonight to ensure stability and then sat/sbt in am -cont steroid, h2 blocker and diphenhydramine  for now.     Best Practice (right click and "Reselect all SmartList Selections" daily)   Diet/type: NPO w/ oral meds DVT prophylaxis SCD Pressure ulcer(s): N/A GI prophylaxis: H2B Lines: N/A Foley:  Yes, and it is still needed Code Status:  full code Last date of multidisciplinary goals of care discussion [per primary]  Labs   CBC: Recent Labs  Lab 08/11/23 1614 08/12/23 0528 08/13/23 0558  WBC 9.3 9.2 10.8*  NEUTROABS 6.5  --   --   HGB 13.6 12.4 13.5  HCT 41.7 37.3 41.3  MCV 89.9 89.4 90.2  PLT 320 306 376    Basic Metabolic Panel: Recent Labs  Lab 08/11/23 1614 08/12/23 0528 08/13/23 0558  NA 138 138 138  K 3.8 3.4* 3.8  CL 104 106 107  CO2 18* 19* 19*  GLUCOSE 157* 163* 146*  BUN 25* 28* 26*  CREATININE 1.05* 0.94 0.81  CALCIUM 9.7 8.9 9.1   GFR: Estimated Creatinine Clearance: 70.1 mL/min (by C-G formula based on SCr of 0.81 mg/dL). Recent Labs  Lab 08/11/23 1614 08/12/23 0528 08/13/23 0558  WBC 9.3 9.2 10.8*    Liver Function Tests: Recent Labs  Lab 08/11/23 1614  AST 33  ALT 16  ALKPHOS 79  BILITOT 1.2  PROT 8.4*  ALBUMIN 4.6   No results for input(s): "LIPASE", "AMYLASE" in the last 168  hours. No results for input(s): "AMMONIA" in the last 168 hours.  ABG No results found for: "PHART", "PCO2ART", "PO2ART", "HCO3", "TCO2", "ACIDBASEDEF", "O2SAT"   Coagulation Profile: No results for input(s): "INR", "PROTIME" in the last 168 hours.  Cardiac Enzymes: Recent Labs  Lab 08/11/23 1614  CKTOTAL 58    HbA1C: Hgb A1c MFr Bld  Date/Time Value Ref Range Status  08/12/2023 05:28 AM 6.8 (H) 4.8 - 5.6 % Final    Comment:    (NOTE)         Prediabetes: 5.7 - 6.4         Diabetes: >6.4         Glycemic control for adults with diabetes: <7.0     CBG: Recent Labs  Lab 08/12/23 2356 08/13/23 0323 08/13/23 0805 08/13/23 1150 08/13/23 1434  GLUCAP 135* 132* 143* 153* 149*    Review of Systems:   As per HPI  Past Medical History:  She,  has a past medical history of Diabetes mellitus without complication (HCC) and Hypertension.   Surgical History:   Past Surgical History:  Procedure Laterality Date   appendectomy     CHOLECYSTECTOMY     FEMUR SURGERY Left    PARTIAL HYSTERECTOMY       Social History:   reports that she has never smoked. She has never used smokeless tobacco. She reports that she does not currently use alcohol. She reports that she does not currently use drugs.   Family History:  Her family history is not on file.   Allergies Allergies  Allergen Reactions   Contrast Media [Iodinated Contrast Media] Shortness Of Breath    Patient reports she has had a reaction x40 years ago   Penicillins Anaphylaxis and Rash    Told that her mouth was swollen and she couldn't breath and told by MD that she shouldn't take it again.   Aspirin     "Burns stomach"     Home Medications  Prior to Admission medications   Medication Sig Start Date End Date Taking? Authorizing Provider  acetaminophen -codeine (TYLENOL  #3) 300-30 MG tablet Take 1 tablet by mouth daily as needed (for migraines). 12/30/18  Yes [provider]  atorvastatin (LIPITOR) 40  MG tablet  11/08/18  Yes [provider]  gabapentin (NEURONTIN) 100 MG capsule Take 100 mg by mouth 3 (three) times daily.   Yes [provider]  metFORMIN (GLUCOPHAGE) 1000 MG tablet Take 1,000 mg by mouth 2 (two) times daily with a meal. 11/08/18  Yes [provider]  methylPREDNISolone  (MEDROL  DOSEPAK) 4 MG TBPK tablet Take by mouth as directed.   Yes [provider]  naproxen  (NAPROSYN ) 500 MG tablet Take 1 tablet (500 mg total) by mouth 2 (two) times daily as needed for moderate pain. 08/09/20  Yes Camellia Caves, MD  olmesartan (BENICAR) 20 MG tablet Take 20 mg by mouth daily.   Yes [provider]  OZEMPIC, 0.25 OR 0.5 MG/DOSE, 2 MG/3ML SOPN Inject 0.5 mg into the  skin every Friday. 07/14/23  Yes [provider]  trimethoprim-polymyxin b (POLYTRIM) ophthalmic solution Place 1 drop into the left eye in the morning, at noon, in the evening, and at bedtime. For 2 days after each monthly eye injection   Yes [provider]     Critical care time: 

## 2023-08-14 ENCOUNTER — Encounter (HOSPITAL_COMMUNITY): Payer: Self-pay | Admitting: Neurosurgery

## 2023-08-14 LAB — CBC WITH DIFFERENTIAL/PLATELET
Abs Immature Granulocytes: 0.02 10*3/uL (ref 0.00–0.07)
Basophils Absolute: 0 10*3/uL (ref 0.0–0.1)
Basophils Relative: 0 %
Eosinophils Absolute: 0 10*3/uL (ref 0.0–0.5)
Eosinophils Relative: 0 %
HCT: 34.2 % — ABNORMAL LOW (ref 36.0–46.0)
Hemoglobin: 11.1 g/dL — ABNORMAL LOW (ref 12.0–15.0)
Immature Granulocytes: 0 %
Lymphocytes Relative: 13 %
Lymphs Abs: 1.1 10*3/uL (ref 0.7–4.0)
MCH: 29.3 pg (ref 26.0–34.0)
MCHC: 32.5 g/dL (ref 30.0–36.0)
MCV: 90.2 fL (ref 80.0–100.0)
Monocytes Absolute: 0.2 10*3/uL (ref 0.1–1.0)
Monocytes Relative: 3 %
Neutro Abs: 6.8 10*3/uL (ref 1.7–7.7)
Neutrophils Relative %: 84 %
Platelets: 253 10*3/uL (ref 150–400)
RBC: 3.79 MIL/uL — ABNORMAL LOW (ref 3.87–5.11)
RDW: 13.4 % (ref 11.5–15.5)
WBC: 8.1 10*3/uL (ref 4.0–10.5)
nRBC: 0 % (ref 0.0–0.2)

## 2023-08-14 LAB — GLUCOSE, CAPILLARY
Glucose-Capillary: 181 mg/dL — ABNORMAL HIGH (ref 70–99)
Glucose-Capillary: 186 mg/dL — ABNORMAL HIGH (ref 70–99)
Glucose-Capillary: 146 mg/dL — ABNORMAL HIGH (ref 70–99)
Glucose-Capillary: 234 mg/dL — ABNORMAL HIGH (ref 70–99)
Glucose-Capillary: 292 mg/dL — ABNORMAL HIGH (ref 70–99)

## 2023-08-14 LAB — POCT I-STAT 7, (LYTES, BLD GAS, ICA,H+H)
Acid-base deficit: 8 mmol/L — ABNORMAL HIGH (ref 0.0–2.0)
Acid-base deficit: 9 mmol/L — ABNORMAL HIGH (ref 0.0–2.0)
Acid-base deficit: 5 mmol/L — ABNORMAL HIGH (ref 0.0–2.0)
Bicarbonate: 20.1 mmol/L (ref 20.0–28.0)
Bicarbonate: 16.7 mmol/L — ABNORMAL LOW (ref 20.0–28.0)
Bicarbonate: 18.8 mmol/L — ABNORMAL LOW (ref 20.0–28.0)
Calcium, Ion: 1.22 mmol/L (ref 1.15–1.40)
Calcium, Ion: 1.2 mmol/L (ref 1.15–1.40)
Calcium, Ion: 1.17 mmol/L (ref 1.15–1.40)
HCT: 37 % (ref 36.0–46.0)
HCT: 33 % — ABNORMAL LOW (ref 36.0–46.0)
HCT: 33 % — ABNORMAL LOW (ref 36.0–46.0)
Hemoglobin: 12.6 g/dL (ref 12.0–15.0)
Hemoglobin: 11.2 g/dL — ABNORMAL LOW (ref 12.0–15.0)
Hemoglobin: 11.2 g/dL — ABNORMAL LOW (ref 12.0–15.0)
O2 Saturation: 100 %
O2 Saturation: 100 %
O2 Saturation: 100 %
Patient temperature: 98.6
Potassium: 3.4 mmol/L — ABNORMAL LOW (ref 3.5–5.1)
Potassium: 3.6 mmol/L (ref 3.5–5.1)
Potassium: 3.9 mmol/L (ref 3.5–5.1)
Sodium: 141 mmol/L (ref 135–145)
Sodium: 139 mmol/L (ref 135–145)
Sodium: 141 mmol/L (ref 135–145)
TCO2: 22 mmol/L (ref 22–32)
TCO2: 18 mmol/L — ABNORMAL LOW (ref 22–32)
TCO2: 20 mmol/L — ABNORMAL LOW (ref 22–32)
pCO2 arterial: 48.7 mmHg — ABNORMAL HIGH (ref 32–48)
pCO2 arterial: 36.1 mmHg (ref 32–48)
pCO2 arterial: 29.1 mmHg — ABNORMAL LOW (ref 32–48)
pH, Arterial: 7.225 — ABNORMAL LOW (ref 7.35–7.45)
pH, Arterial: 7.272 — ABNORMAL LOW (ref 7.35–7.45)
pH, Arterial: 7.418 (ref 7.35–7.45)
pO2, Arterial: 305 mmHg — ABNORMAL HIGH (ref 83–108)
pO2, Arterial: 359 mmHg — ABNORMAL HIGH (ref 83–108)
pO2, Arterial: 171 mmHg — ABNORMAL HIGH (ref 83–108)

## 2023-08-14 LAB — BASIC METABOLIC PANEL WITH GFR
Anion gap: 11 (ref 5–15)
BUN: 23 mg/dL (ref 8–23)
CO2: 19 mmol/L — ABNORMAL LOW (ref 22–32)
Calcium: 8.3 mg/dL — ABNORMAL LOW (ref 8.9–10.3)
Chloride: 110 mmol/L (ref 98–111)
Creatinine, Ser: 0.83 mg/dL (ref 0.44–1.00)
GFR, Estimated: >60 mL/min (ref >60)
Glucose, Bld: 195 mg/dL — ABNORMAL HIGH (ref 70–99)
Potassium: 3.8 mmol/L (ref 3.5–5.1)
Sodium: 140 mmol/L (ref 135–145)

## 2023-08-14 LAB — MAGNESIUM: Magnesium: 1.6 mg/dL — ABNORMAL LOW (ref 1.7–2.4)

## 2023-08-14 LAB — TRIGLYCERIDES: Triglycerides: 132 mg/dL (ref <150)

## 2023-08-14 LAB — PHOSPHORUS: Phosphorus: 2.4 mg/dL — ABNORMAL LOW (ref 2.5–4.6)

## 2023-08-14 MED ORDER — ORAL CARE MOUTH RINSE
15.0000 mL | OROMUCOSAL | Status: DC
  Administered 2023-08-14 – 2023-08-17 (×11): 15 mL via OROMUCOSAL

## 2023-08-14 MED ORDER — FAMOTIDINE 20 MG PO TABS
20.0000 mg | ORAL_TABLET | Freq: Two times a day (BID) | ORAL | Status: AC
  Administered 2023-08-14 – 2023-08-15 (×3): 20 mg via ORAL
  Filled 2023-08-14 (×3): qty 1

## 2023-08-14 MED ORDER — ORAL CARE MOUTH RINSE: 15.0000 mL | OROMUCOSAL | Status: DC | PRN

## 2023-08-14 MED ORDER — METHOCARBAMOL 500 MG PO TABS
500.0000 mg | ORAL_TABLET | Freq: Four times a day (QID) | ORAL | Status: DC | PRN
  Administered 2023-08-14 – 2023-08-17 (×7): 500 mg via ORAL
  Filled 2023-08-14 (×7): qty 1

## 2023-08-14 MED ORDER — INSULIN ASPART 100 UNIT/ML IJ SOLN
0.0000 [IU] | INTRAMUSCULAR | Status: DC
  Administered 2023-08-14: 2 [IU] via SUBCUTANEOUS
  Administered 2023-08-14: 1 [IU] via SUBCUTANEOUS
  Administered 2023-08-14: 3 [IU] via SUBCUTANEOUS
  Administered 2023-08-14: 5 [IU] via SUBCUTANEOUS
  Administered 2023-08-15 (×2): 2 [IU] via SUBCUTANEOUS
  Administered 2023-08-15: 5 [IU] via SUBCUTANEOUS
  Administered 2023-08-15: 7 [IU] via SUBCUTANEOUS
  Administered 2023-08-15: 2 [IU] via SUBCUTANEOUS
  Administered 2023-08-15 – 2023-08-16 (×3): 3 [IU] via SUBCUTANEOUS
  Administered 2023-08-16: 5 [IU] via SUBCUTANEOUS
  Administered 2023-08-16 (×2): 3 [IU] via SUBCUTANEOUS
  Administered 2023-08-17: 2 [IU] via SUBCUTANEOUS
  Administered 2023-08-17: 1 [IU] via SUBCUTANEOUS

## 2023-08-14 MED ORDER — DIPHENHYDRAMINE HCL 50 MG/ML IJ SOLN
12.5000 mg | Freq: Three times a day (TID) | INTRAMUSCULAR | Status: AC
  Administered 2023-08-14 (×2): 12.5 mg via INTRAVENOUS
  Filled 2023-08-14 (×2): qty 1

## 2023-08-14 MED ORDER — FENTANYL BOLUS VIA INFUSION
25.0000 ug | INTRAVENOUS | Status: DC | PRN
  Administered 2023-08-14: 25 ug via INTRAVENOUS
  Administered 2023-08-14: 50 ug via INTRAVENOUS

## 2023-08-14 MED ORDER — MAGNESIUM SULFATE 2 GM/50ML IV SOLN
2.0000 g | Freq: Once | INTRAVENOUS | Status: AC
  Administered 2023-08-14: 2 g via INTRAVENOUS
  Filled 2023-08-14: qty 50

## 2023-08-14 MED ORDER — LACTATED RINGERS IV BOLUS
500.0000 mL | Freq: Once | INTRAVENOUS | Status: AC
  Administered 2023-08-14: 500 mL via INTRAVENOUS

## 2023-08-14 MED ORDER — IRBESARTAN 150 MG PO TABS
75.0000 mg | ORAL_TABLET | Freq: Every day | ORAL | Status: DC
  Administered 2023-08-14 – 2023-08-17 (×4): 75 mg via ORAL
  Filled 2023-08-14 (×4): qty 1

## 2023-08-14 MED ORDER — METHOCARBAMOL 1000 MG/10ML IJ SOLN: 500.0000 mg | Freq: Four times a day (QID) | INTRAMUSCULAR | Status: DC | PRN

## 2023-08-14 NOTE — Progress Notes (Signed)
 Subjective:  The patient is sedated with propofol and fentanyl .  She is easily arousable in he is in no apparent distress.  Her grandson is at the bedside.  We discussed her reaction to medications during induction for anesthesia.  Objective: Vital signs in last 24 hours: Temp:  [96.7 F (35.9 C)-98.9 F (37.2 C)] 98 F (36.7 C) (06/10 0700) Pulse Rate:  [58-90] 70 (06/10 0800) Resp:  [16-19] 16 (06/10 0800) BP: (92-156)/(50-66) 121/58 (06/10 0800) SpO2:  [95 %-100 %] 95 % (06/10 0800) Arterial Line BP: (111-303)/(50-280) 122/100 (06/10 0800) FiO2 (%):  [40 %-60 %] 40 % (06/10 0742) Weight:  [86.3 kg-89.8 kg] 86.3 kg (06/09 2000) Estimated body mass index is 28.93 kg/m as calculated from the following:   Height as of this encounter: 5\' 8"  (1.727 m).   Weight as of this encounter: 86.3 kg.   Intake/Output from previous day: 06/09 0701 - 06/10 0700 In: 3255.5 [I.V.:2204.4; IV Piggyback:1051.1] Out: 735 [Urine:635; Blood:100] Intake/Output this shift: Total I/O In: 38.7 [I.V.:38.7] Out: -   Physical exam   The patient is, but easily arousable.  She is moving her lower extremities well.  Lab Results: Recent Labs    08/13/23 0558 08/13/23 1721 08/14/23 0122 08/14/23 0443  WBC 10.8*  --   --  8.1  HGB 13.5   < > 11.2* 11.1*  HCT 41.3   < > 33.0* 34.2*  PLT 376  --   --  253   < > = values in this interval not displayed.   BMET Recent Labs    08/13/23 0558 08/13/23 1721 08/14/23 0122 08/14/23 0443  NA 138   < > 141 140  K 3.8   < > 3.9 3.8  CL 107  --   --  110  CO2 19*  --   --  19*  GLUCOSE 146*  --   --  195*  BUN 26*  --   --  23  CREATININE 0.81  --   --  0.83  CALCIUM 9.1  --   --  8.3*   < > = values in this interval not displayed.    Studies/Results: DG Lumbar Spine Complete Result Date: 08/13/2023 CLINICAL DATA:  Elective surgery. EXAM: LUMBAR SPINE - COMPLETE 4+ VIEW COMPARISON:  CT lumbar spine 08/11/2023 FINDINGS: Four intraoperative views lumbar  spine. Initial view demonstrates a surgical instrument over the L2/L3 vertebral bodies. Second film demonstrates blunt tip probe at the L3 vertebral body. Third film demonstrates a blunt tip probe at the posterior aspect of the L4 vertebral body. Fourth view demonstrates a curved tip probe at the L3-L4 disc space. IMPRESSION: Intraoperative views as above. Electronically Signed   By: Deboraha Fallow M.D.   On: 08/13/2023 20:20    Assessment/Plan: Postop day # 1. The patient is doing well neurologically.  It looks like she is ready for extubation.  I will defer to Critical Care's expertise.  I appreciate their help with this patient.  LOS: 1 day     Priscilla Adams 08/14/2023, 8:11 AM     Patient ID: Priscilla Adams, female   DOB: [birth year], 73 y.o.   MRN: [id number]

## 2023-08-14 NOTE — Progress Notes (Signed)
 NAME:  Priscilla Adams, MRN:  [id number], DOB:  [birth year], LOS: 1 ADMISSION DATE:  08/11/2023, CONSULTATION DATE:  08/13/23 REFERRING MD:  neurosurgery, CHIEF COMPLAINT:  post operative medical management   History of Present Illness:  73 yo female presented with severe L hip pain down through the L knee. Pt was recently seen at a separate facility a few days ago with negative xrays. She endorsed that the pain was sudden in onset and was located on the Left side only, mainly anterior aspect. She denies trauma or precipitating event. She was prescribed lidoderm  patched yet her sx continued to worsen to where yesterday she was unable to get out of bed or ambulate. She denies incontinence, fever, chills, numbness. She was evaluated for dvt (negative), normal CK, imaging with MRI revealed degenerative changes as well as L3-L4 Left lateral herniated disc.  Neurosurgery was consulted and recommended discectomy which she underwent today. Post operatively she was noted to be hypotensive with presumed reaction to induction medications. Anesthesia and neurosurgery have recommended close monitoring in ICU overnight.   Ccm was consulted for medical management post operatively.    Pertinent  Medical History  Htn Hyperlipidemia t2dm  Significant Hospital Events: Including procedures, antibiotic start and stop dates in addition to other pertinent events   Admitted to hospital 6/8 L3-4 left discectomy, 6/9, presumed anaphylactic reaction to induction meds, left intubated,  transferred to ICU  Interim History / Subjective:  20 beat run of NSVT overnight, pending mag Off sedation, on SBT, likely extubation this am  Objective    Blood pressure (!) 99/52, pulse 60, temperature 98 F (36.7 C), temperature source Axillary, resp. rate 18, height 5\' 8"  (1.727 m), weight 86.3 kg, SpO2 100%.    Vent Mode: PRVC FiO2 (%):  [40 %-60 %] 40 % Set Rate:  [18 bmp] 18 bmp Vt Set:  [510 mL] 510 mL PEEP:  [5 cmH20] 5  cmH20 Plateau Pressure:  [14 cmH20-15 cmH20] 15 cmH20   Intake/Output Summary (Last 24 hours) at 08/14/2023 0746 Last data filed at 08/14/2023 0600 Gross per 24 hour  Intake 3255.53 ml  Output 735 ml  Net 2520.53 ml   Filed Weights   08/13/23 1454 08/13/23 2000  Weight: 89.8 kg 86.3 kg    Examination: Propofol 30, fent 75> turned off General:  Older female in bed in NAD  HEENT: MM pink/moist, ETT, pupils 4/r, no obvious angioedema Neuro: sleepy but opens eyes to verbal and follows commands in all extremities CV: rr, NSR 70s, R radial aline PULM:  non labored, on PSV 10/5> 5/5, good TVs but rate ~8, clear anteriorly  GI: soft, bs+, NT, purwick Extremities: warm/dry, no LE edema  Skin: no rashes   afebrile UOP 635 plus 2 unmeasured Net +2.5L Labs reviewed > K 3.8, sCr 0.83  Resolved problem list   Assessment and Plan  S/p L3-4 left discectomy Chronic pain  Left lower extremity radiculopathy Presumed anaphylactic reaction to induction agent Acute post operative hypoxic resp insufficiency  H/o htn T2dm, hyperglycemia in setting of steroids Hyperlipidemia NSVT - post operative care per neurosurgery - multimodal pain control per neurosurgery - tolerated SBT well this am with +cuff leak since extubated and doing well - cont aggressive pulmonary hygiene, IS, wean supplemental O2 for sat goal > 92% - PT/ OT - holding am irbesartan> consider resuming at half dose.  BP is currently normotensive, prn hydralazine - d/c aline - CBG/ SSI prn  - cont solumedrol, H2B, and lower dose  diphenhydramine  for today - pending tryptase level sent by anesthesia - cont tele-monitoring, awaiting mag level.  Optimize electrolytes  - if remains stable, consider transfer out of ICU later this afternoon    Best Practice (right click and "Reselect all SmartList Selections" daily)   Diet/type: NPO DVT prophylaxis SCD Pressure ulcer(s): N/A GI prophylaxis: H2B Lines: N/A Foley:  removal  ordered  Code Status:  full code Last date of multidisciplinary goals of care discussion [per primary]  Grandson and daughter updated at bedside 6/10 am.  Labs   CBC: Recent Labs  Lab 08/11/23 1614 08/12/23 0528 08/13/23 0558 08/13/23 1721 08/13/23 1859 08/13/23 2050 08/14/23 0122 08/14/23 0443  WBC 9.3 9.2 10.8*  --   --   --   --  8.1  NEUTROABS 6.5  --   --   --   --   --   --  6.8  HGB 13.6 12.4 13.5 12.6 11.2* 11.6* 11.2* 11.1*  HCT 41.7 37.3 41.3 37.0 33.0* 34.0* 33.0* 34.2*  MCV 89.9 89.4 90.2  --   --   --   --  90.2  PLT 320 306 376  --   --   --   --  253    Basic Metabolic Panel: Recent Labs  Lab 08/11/23 1614 08/12/23 0528 08/13/23 0558 08/13/23 1721 08/13/23 1859 08/13/23 2050 08/14/23 0122 08/14/23 0443  NA 138 138 138 141 139 140 141 140  K 3.8 3.4* 3.8 3.4* 3.6 3.5 3.9 3.8  CL 104 106 107  --   --   --   --  110  CO2 18* 19* 19*  --   --   --   --  19*  GLUCOSE 157* 163* 146*  --   --   --   --  195*  BUN 25* 28* 26*  --   --   --   --  23  CREATININE 1.05* 0.94 0.81  --   --   --   --  0.83  CALCIUM 9.7 8.9 9.1  --   --   --   --  8.3*   GFR: Estimated Creatinine Clearance: 68.4 mL/min (by C-G formula based on SCr of 0.83 mg/dL). Recent Labs  Lab 08/11/23 1614 08/12/23 0528 08/13/23 0558 08/14/23 0443  WBC 9.3 9.2 10.8* 8.1    Liver Function Tests: Recent Labs  Lab 08/11/23 1614  AST 33  ALT 16  ALKPHOS 79  BILITOT 1.2  PROT 8.4*  ALBUMIN 4.6   No results for input(s): "LIPASE", "AMYLASE" in the last 168 hours. No results for input(s): "AMMONIA" in the last 168 hours.  ABG    Component Value Date/Time   PHART 7.418 08/14/2023 0122   PCO2ART 29.1 (L) 08/14/2023 0122   PO2ART 171 (H) 08/14/2023 0122   HCO3 18.8 (L) 08/14/2023 0122   TCO2 20 (L) 08/14/2023 0122   ACIDBASEDEF 5.0 (H) 08/14/2023 0122   O2SAT 100 08/14/2023 0122     Coagulation Profile: No results for input(s): "INR", "PROTIME" in the last 168  hours.  Cardiac Enzymes: Recent Labs  Lab 08/11/23 1614  CKTOTAL 58    HbA1C: Hgb A1c MFr Bld  Date/Time Value Ref Range Status  08/12/2023 05:28 AM 6.8 (H) 4.8 - 5.6 % Final    Comment:    (NOTE)         Prediabetes: 5.7 - 6.4         Diabetes: >6.4  Glycemic control for adults with diabetes: <7.0     CBG: Recent Labs  Lab 08/13/23 1150 08/13/23 1434 08/13/23 2001 08/13/23 2307 08/14/23 0322  GLUCAP 153* 149* 188* 189* 181*    Allergies Allergies  Allergen Reactions   Contrast Media [Iodinated Contrast Media] Shortness Of Breath    Patient reports she has had a reaction x40 years ago   Penicillins Anaphylaxis and Rash    Told that her mouth was swollen and she couldn't breath and told by MD that she shouldn't take it again.   Rocuronium Anaphylaxis    Pt with hypotension and erythema post induction for Lumbar Laminectomy surgery on 6/9. Unclear etiology  but Rocuronium was one of the administered agents.Tryptase level sent and pending.    Aspirin     "Burns stomach"     Home Medications  Prior to Admission medications   Medication Sig Start Date End Date Taking? Authorizing Provider  acetaminophen -codeine (TYLENOL  #3) 300-30 MG tablet Take 1 tablet by mouth daily as needed (for migraines). 12/30/18  Yes [provider]  atorvastatin (LIPITOR) 40 MG tablet  11/08/18  Yes [provider]  gabapentin (NEURONTIN) 100 MG capsule Take 100 mg by mouth 3 (three) times daily.   Yes [provider]  metFORMIN (GLUCOPHAGE) 1000 MG tablet Take 1,000 mg by mouth 2 (two) times daily with a meal. 11/08/18  Yes [provider]  methylPREDNISolone  (MEDROL  DOSEPAK) 4 MG TBPK tablet Take by mouth as directed.   Yes [provider]  naproxen  (NAPROSYN ) 500 MG tablet Take 1 tablet (500 mg total) by mouth 2 (two) times daily as needed for moderate pain. 08/09/20  Yes Camellia Caves, MD  olmesartan (BENICAR) 20 MG tablet Take 20 mg by  mouth daily.   Yes [provider]  OZEMPIC, 0.25 OR 0.5 MG/DOSE, 2 MG/3ML SOPN Inject 0.5 mg into the skin every Friday. 07/14/23  Yes [provider]  trimethoprim-polymyxin b (POLYTRIM) ophthalmic solution Place 1 drop into the left eye in the morning, at noon, in the evening, and at bedtime. For 2 days after each monthly eye injection   Yes [provider]     Critical care time: 40 mins       Early Glisson, MSN, AG-ACNP-BC [facility] 08/14/2023, 7:47 AM  See Amion for pager If no response to pager , please call [phone] until 7pm After 7:00 pm call Elink  [phone]

## 2023-08-14 NOTE — TOC Progression Note (Signed)
 Transition of Care [facility]) - Progression Note    Patient Details  Name: Priscilla Adams MRN: [id number] Date of Birth: 1951-01-06  [facility] CM/SW Contact  Eusebio High, RN Phone Number: 08/14/2023, 3:49 PM  Clinical Narrative:     RNCM acknowledges "Consult to Case Management for  PT / OT / SLP / DME as needed  And will follow for recommendations         Expected Discharge Plan and Services                                               Social Determinants of Health (SDOH) Interventions SDOH Screenings   Food Insecurity: No Food Insecurity (08/12/2023)  Housing: Low Risk  (08/12/2023)  Transportation Needs: No Transportation Needs (08/12/2023)  Utilities: Not At Risk (08/12/2023)  Social Connections: Unknown (08/12/2023)  Tobacco Use: Low Risk  (08/13/2023)    Readmission Risk Interventions     No data to display

## 2023-08-14 NOTE — Progress Notes (Signed)
 Pt doing well post extubation, tolerating advancing diet and remains hemodynamically stable.  Discussed with Meghan, NP with NSGY.  Ok to transfer out of ICU.  NSGY to remain primary.  Have asked TRH to assist with ongoing medical management starting 6/11.  Thereafter, PCCM available as needed.        Early Glisson, MSN, AG-ACNP-BC [facility] 08/14/2023, 2:43 PM  See Amion for pager If no response to pager , please call [phone] until 7pm After 7:00 pm call Elink  [phone]

## 2023-08-14 NOTE — Procedures (Signed)
 Extubation Procedure Note  Patient Details:   Name: Priscilla Adams DOB: 19-Oct-1950 MRN: [id number]   Airway Documentation:    Vent end date: 08/14/23 Vent end time: 0859   Evaluation  O2 sats: stable throughout Complications: No apparent complications Patient did tolerate procedure well. Bilateral Breath Sounds: Clear, Diminished   Yes,  Per CCM order, RT extubated pt to [place]. Pt did have a positive cuff leak. Pt tolerated well with SVS, no stridor noted and able to state her name.   Stiles Maxcy R 08/14/2023, 8:59 AM

## 2023-08-14 NOTE — Progress Notes (Signed)
 eLink Physician-Brief Progress Note Patient Name: Priscilla Adams DOB: May 07, 1950 MRN: [id number]   Date of Service  08/14/2023  HPI/Events of Note  Patient had a 20 beat run of NSVT No significant electrolyte abnormality  eICU Interventions  Adeed on Mg and Phos eLink to be informed if correction needed     Intervention Category Intermediate Interventions: Arrhythmia - evaluation and management  Turner Gains 08/14/2023, 5:41 AM

## 2023-08-14 NOTE — Plan of Care (Signed)
  Problem: Education: Goal: Ability to describe self-care measures that may prevent or decrease complications (Diabetes Survival Skills Education) will improve Outcome: Progressing Goal: Individualized Educational Video(s) Outcome: Progressing   Problem: Coping: Goal: Ability to adjust to condition or change in health will improve Outcome: Progressing   Problem: Fluid Volume: Goal: Ability to maintain a balanced intake and output will improve Outcome: Progressing   Problem: Health Behavior/Discharge Planning: Goal: Ability to identify and utilize available resources and services will improve Outcome: Progressing Goal: Ability to manage health-related needs will improve Outcome: Progressing   Problem: Metabolic: Goal: Ability to maintain appropriate glucose levels will improve Outcome: Progressing   Problem: Nutritional: Goal: Maintenance of adequate nutrition will improve Outcome: Progressing Goal: Progress toward achieving an optimal weight will improve Outcome: Progressing   Problem: Skin Integrity: Goal: Risk for impaired skin integrity will decrease Outcome: Progressing   Problem: Tissue Perfusion: Goal: Adequacy of tissue perfusion will improve Outcome: Progressing   Problem: Education: Goal: Knowledge of General Education information will improve Description: Including pain rating scale, medication(s)/side effects and non-pharmacologic comfort measures Outcome: Progressing   Problem: Health Behavior/Discharge Planning: Goal: Ability to manage health-related needs will improve Outcome: Progressing   Problem: Clinical Measurements: Goal: Ability to maintain clinical measurements within normal limits will improve Outcome: Progressing Goal: Will remain free from infection Outcome: Progressing Goal: Diagnostic test results will improve Outcome: Progressing Goal: Respiratory complications will improve Outcome: Progressing Goal: Cardiovascular complication will  be avoided Outcome: Progressing   Problem: Activity: Goal: Risk for activity intolerance will decrease Outcome: Progressing   Problem: Nutrition: Goal: Adequate nutrition will be maintained Outcome: Progressing   Problem: Coping: Goal: Level of anxiety will decrease Outcome: Progressing   Problem: Elimination: Goal: Will not experience complications related to bowel motility Outcome: Progressing Goal: Will not experience complications related to urinary retention Outcome: Progressing   Problem: Pain Managment: Goal: General experience of comfort will improve and/or be controlled Outcome: Progressing   Problem: Safety: Goal: Ability to remain free from injury will improve Outcome: Progressing   Problem: Skin Integrity: Goal: Risk for impaired skin integrity will decrease Outcome: Progressing   Problem: Education: Goal: Ability to verbalize activity precautions or restrictions will improve Outcome: Progressing Goal: Knowledge of the prescribed therapeutic regimen will improve Outcome: Progressing Goal: Understanding of discharge needs will improve Outcome: Progressing   Problem: Activity: Goal: Ability to avoid complications of mobility impairment will improve Outcome: Progressing Goal: Ability to tolerate increased activity will improve Outcome: Progressing Goal: Will remain free from falls Outcome: Progressing   Problem: Bowel/Gastric: Goal: Gastrointestinal status for postoperative course will improve Outcome: Progressing   Problem: Clinical Measurements: Goal: Ability to maintain clinical measurements within normal limits will improve Outcome: Progressing Goal: Postoperative complications will be avoided or minimized Outcome: Progressing Goal: Diagnostic test results will improve Outcome: Progressing   Problem: Pain Management: Goal: Pain level will decrease Outcome: Progressing   Problem: Skin Integrity: Goal: Will show signs of wound  healing Outcome: Progressing   Problem: Health Behavior/Discharge Planning: Goal: Identification of resources available to assist in meeting health care needs will improve Outcome: Progressing   Problem: Bladder/Genitourinary: Goal: Urinary functional status for postoperative course will improve Outcome: Progressing   Problem: Safety: Goal: Non-violent Restraint(s) Outcome: Progressing

## 2023-08-14 NOTE — Progress Notes (Signed)
 Physical Therapy Treatment Patient Details Name: Priscilla Adams MRN: [id number] DOB: 1951/01/08 Today's Date: 08/14/2023   History of Present Illness Pt is 73 yo female who presents on 08/11/23 with severe pain from L hip down through knee. MRI reveals bilateral L4-L5 lateral recess stenosis and a far lateral herniated disc at L3-4. S/p discectomy using micro-dissection 6/9. PMH: DM2, HTN    PT Comments  Pt seen after L3-4 discectomy, presenting with increased pain in her back (surgical pain) but reports decreased pain in LLE. The pt also denies any change in sensation and presents with equal strength in LE to MMT. The pt required increased assist to manage bed mobility with max cues for precautions and assist to manage log roll. She also presents with increased assist to manage sit-stand transfers, needing modA initially and progressing to minA with increased time. The pt was limited to small lateral steps along EOB and then to recliner, required minA to steady and manage RW. Family endorses ability to provide 24/7 support as pt needs assist with all mobility. Will continue to follow acutely to progress functional strength, power, stability, and activity tolerance.    If plan is discharge home, recommend the following: Assistance with cooking/housework;Assist for transportation;Help with stairs or ramp for entrance;A lot of help with walking and/or transfers;A lot of help with bathing/dressing/bathroom;Supervision due to cognitive status;Direct supervision/assist for medications management;Direct supervision/assist for financial management   Can travel by private vehicle        Equipment Recommendations  None recommended by PT    Recommendations for Other Services       Precautions / Restrictions Precautions Precautions: Back Precaution Booklet Issued: Yes (comment) Recall of Precautions/Restrictions: Impaired Precaution/Restrictions Comments: needs reminding of precautions Required Braces  or Orthoses:  (no brace needed) Restrictions Weight Bearing Restrictions Per Provider Order: No     Mobility  Bed Mobility Overal bed mobility: Needs Assistance Bed Mobility: Rolling, Sidelying to Sit Rolling: Mod assist, Used rails Sidelying to sit: Mod assist, HOB elevated, Used rails       General bed mobility comments: modA with cues for log roll, pt not adhering    Transfers Overall transfer level: Needs assistance Equipment used: Rolling walker (2 wheels) Transfers: Sit to/from Stand, Bed to chair/wheelchair/BSC Sit to Stand: Mod assist   Step pivot transfers: Min assist       General transfer comment: modA on initial stand, posterior lean. unable to step initially but progressed to lateral steps with min-modA and assist to RW. poor activity tolerance, reports dizzy    Ambulation/Gait Ambulation/Gait assistance: Min assist, Mod assist, +2 safety/equipment Gait Distance (Feet): 5 Feet Assistive device: Rolling walker (2 wheels) Gait Pattern/deviations: Step-to pattern, Decreased stride length, Trunk flexed, Narrow base of support Gait velocity: decreased Gait velocity interpretation: <1.31 ft/sec, indicative of household ambulator   General Gait Details: small lateral steps with BUE support, limited to stepping to chair and along EOB due to poor tolerance. VSS, +2 for chair follot to progress   Stairs             Wheelchair Mobility     Tilt Bed    Modified Rankin (Stroke Patients Only)       Balance Overall balance assessment: Needs assistance Sitting-balance support: No upper extremity supported, Feet supported Sitting balance-Leahy Scale: Good     Standing balance support: Bilateral upper extremity supported, During functional activity Standing balance-Leahy Scale: Poor Standing balance comment: due to pain  Communication Communication Communication: Impaired Factors Affecting Communication: Hearing  impaired  Cognition Arousal: Alert Behavior During Therapy: WFL for tasks assessed/performed   PT - Cognitive impairments: Memory, Problem solving, Safety/Judgement                       PT - Cognition Comments: reports that she needs to be taught several times before she can remember Following commands: Intact      Cueing Cueing Techniques: Verbal cues  Exercises      General Comments General comments (skin integrity, edema, etc.): BP stable, educated on spinal precautions      Pertinent Vitals/Pain Pain Assessment Pain Assessment: Faces Faces Pain Scale: Hurts even more Pain Location: back Pain Descriptors / Indicators: Aching, Shooting Pain Intervention(s): Limited activity within patient's tolerance, Monitored during session, Repositioned, RN gave pain meds during session    Home Living Family/patient expects to be discharged to:: Private residence Living Arrangements: Alone Available Help at Discharge: Family;Available PRN/intermittently Type of Home: House                      PT Goals (current goals can now be found in the care plan section) Acute Rehab PT Goals Patient Stated Goal: return home, decreased pain PT Goal Formulation: With patient Time For Goal Achievement: 08/27/23 Potential to Achieve Goals: Good Progress towards PT goals: Progressing toward goals    Frequency    Min 2X/week       AM-PAC PT "6 Clicks" Mobility   Outcome Measure  Help needed turning from your back to your side while in a flat bed without using bedrails?: A Little Help needed moving from lying on your back to sitting on the side of a flat bed without using bedrails?: A Little Help needed moving to and from a bed to a chair (including a wheelchair)?: A Lot Help needed standing up from a chair using your arms (e.g., wheelchair or bedside chair)?: A Lot Help needed to walk in hospital room?: Total (<20 ft) Help needed climbing 3-5 steps with a railing? :  Total 6 Click Score: 12    End of Session Equipment Utilized During Treatment: Gait belt;Oxygen  Activity Tolerance: Patient limited by pain Patient left: in chair;with call bell/phone within reach;with chair alarm set;with family/visitor present Nurse Communication: Mobility status PT Visit Diagnosis: Pain;Difficulty in walking, not elsewhere classified (R26.2) Pain - Right/Left: Left Pain - part of body: Leg     Time: 1128-1207 PT Time Calculation (min) (ACUTE ONLY): 39 min  Charges:    $Gait Training: 8-22 mins $Therapeutic Exercise: 8-22 mins $Therapeutic Activity: 8-22 mins PT General Charges $$ ACUTE PT VISIT: 1 Visit                     Barnabas Booth, PT, DPT   Acute Rehabilitation Department Office [phone] Secure Chat Communication Preferred   Priscilla Adams 08/14/2023, 3:24 PM

## 2023-08-14 NOTE — Progress Notes (Signed)
 Chart reviewed.  Patient went for discectomy yesterday however postoperatively due to hypotension/reaction to anesthesia, neurosurgery decided to observe for overnight and thus patient was transferred to ICU and critical care was also consulted.  Chart reviewed.  Patient still remains intubated.  Since critical care is on board and she is intubated, hospitalist will sign off.  We will be happy to assume care once patient is stable enough to come to the medical floor.  Appreciate both neurosurgery and PCCM taking care of this patient.

## 2023-08-14 NOTE — Progress Notes (Signed)
 eLink Physician-Brief Progress Note Patient Name: Priscilla Adams DOB: 02/18/1951 MRN: [id number]   Date of Service  08/14/2023  HPI/Events of Note  Notified of BP trending down Current cuff pressure is 92/50, aline pressure is 112/50 Seen adequately sedated on propofol 10 and Fentanyl  25  eICU Interventions  Ordered a trial of 500 cc LR bolus     Intervention Category Intermediate Interventions: Hypotension - evaluation and management  Turner Gains 08/14/2023, 3:11 AM

## 2023-08-14 NOTE — TOC Progression Note (Addendum)
 Transition of Care [facility]) - Progression Note    Patient Details  Name: Priscilla Adams MRN: [id number] Date of Birth: 24-Feb-1951  Transition of [facility]) CM/SW Contact  Eusebio High, RN Phone Number: 08/14/2023, 3:59 PM  Clinical Narrative:     Patient being recommended Home Health PT  OT consult filed. [facility] will provide services. Home Health orders will need to be entered upon DC.   AVS has been updated.   No DME recommended as of this writing     TOC will continue to follow patient for any additional discharge needs          Expected Discharge Plan and Services                                               Social Determinants of Health (SDOH) Interventions SDOH Screenings   Food Insecurity: No Food Insecurity (08/12/2023)  Housing: Low Risk  (08/12/2023)  Transportation Needs: No Transportation Needs (08/12/2023)  Utilities: Not At Risk (08/12/2023)  Social Connections: Unknown (08/12/2023)  Tobacco Use: Low Risk  (08/13/2023)    Readmission Risk Interventions     No data to display

## 2023-08-15 DIAGNOSIS — R52 Pain, unspecified: Secondary | ICD-10-CM | POA: Diagnosis not present

## 2023-08-15 LAB — RENAL FUNCTION PANEL
Albumin: 3.3 g/dL — ABNORMAL LOW (ref 3.5–5.0)
Anion gap: 8 (ref 5–15)
BUN: 18 mg/dL (ref 8–23)
CO2: 26 mmol/L (ref 22–32)
Calcium: 8.7 mg/dL — ABNORMAL LOW (ref 8.9–10.3)
Chloride: 106 mmol/L (ref 98–111)
Creatinine, Ser: 0.79 mg/dL (ref 0.44–1.00)
GFR, Estimated: >60 mL/min (ref >60)
Glucose, Bld: 229 mg/dL — ABNORMAL HIGH (ref 70–99)
Phosphorus: 2.7 mg/dL (ref 2.5–4.6)
Potassium: 4.1 mmol/L (ref 3.5–5.1)
Sodium: 140 mmol/L (ref 135–145)

## 2023-08-15 LAB — GLUCOSE, CAPILLARY
Glucose-Capillary: 158 mg/dL — ABNORMAL HIGH (ref 70–99)
Glucose-Capillary: 176 mg/dL — ABNORMAL HIGH (ref 70–99)
Glucose-Capillary: 186 mg/dL — ABNORMAL HIGH (ref 70–99)
Glucose-Capillary: 223 mg/dL — ABNORMAL HIGH (ref 70–99)
Glucose-Capillary: 289 mg/dL — ABNORMAL HIGH (ref 70–99)
Glucose-Capillary: 327 mg/dL — ABNORMAL HIGH (ref 70–99)

## 2023-08-15 LAB — TRYPTASE: Tryptase: 8.3 ug/L (ref 2.2–13.2)

## 2023-08-15 LAB — MAGNESIUM: Magnesium: 2.2 mg/dL (ref 1.7–2.4)

## 2023-08-15 MED ORDER — ACETAMINOPHEN 650 MG RE SUPP: 650.0000 mg | Freq: Four times a day (QID) | RECTAL | Status: DC | PRN

## 2023-08-15 MED ORDER — ACETAMINOPHEN 325 MG PO TABS
650.0000 mg | ORAL_TABLET | ORAL | Status: DC | PRN
  Administered 2023-08-15 – 2023-08-16 (×4): 650 mg via ORAL
  Filled 2023-08-15 (×3): qty 2

## 2023-08-15 NOTE — Evaluation (Signed)
 Occupational Therapy Evaluation Patient Details Name: Priscilla Adams MRN: [id number] DOB: February 23, 1951 Today's Date: 08/15/2023   History of Present Illness   Pt is 73 yo female who presents on 08/11/23 with severe pain from L hip down through knee. MRI reveals bilateral L4-L5 lateral recess stenosis and a far lateral herniated disc at L3-4. S/p discectomy using micro-dissection 6/9. PMH: DM2, HTN     Clinical Impressions At baseline, pt is Independent with ADLs, IADLS, and functional mobility without an AD. Pt reports use of walker and increased time and effort for ADLs and IADLs over the past week due to pain. Pt now presents with decreased activity tolerance, decreased B UE strength, decreased balance, decreased knowledge of precautions, decreased knowledge of AE/DME, mildly decreased cognition, pain affecting functional level and decreased safety and independence with functional tasks. Pt currently demonstrates ability to complete UB ADLs with Set up to Contact guard assist, LB ADLs with Min to Mod assist, bed mobility with Min assist, and functional mobility with a RW with Contact guard assist. Pt requires cues for adhering to back precautions, safety, and technique throughout tasks. Pt participated well in session and is motivated to return to PLOF. Pt will benefit from acute skilled OT services to address deficits outlined below and to increase safety and independence with functional tasks. Post acute discharge, pt will benefit from [facility] OT to maximize rehab potential.      If plan is discharge home, recommend the following:   A little help with walking and/or transfers;A lot of help with bathing/dressing/bathroom;Assistance with cooking/housework;Assist for transportation;Help with stairs or ramp for entrance     Functional Status Assessment   Patient has had a recent decline in their functional status and demonstrates the ability to make significant improvements in function in a reasonable  and predictable amount of time.     Equipment Recommendations   None recommended by OT (Pt already has needed equipment)     Recommendations for Other Services         Precautions/Restrictions   Precautions Precautions: Back Precaution Booklet Issued: Yes (comment) Recall of Precautions/Restrictions: Impaired Precaution/Restrictions Comments: needs reminding of precautions Restrictions Weight Bearing Restrictions Per Provider Order: No     Mobility Bed Mobility Overal bed mobility: Needs Assistance Bed Mobility: Rolling, Sidelying to Sit, Sit to Sidelying Rolling: Contact guard assist, Used rails Sidelying to sit: Min assist, Used rails     Sit to sidelying: Min assist, Used rails General bed mobility comments: Pt requring cues for technique and safety with pt initially flopping back on bed when transitioning to supine due to pt reported dizzines.    Transfers Overall transfer level: Needs assistance Equipment used: Rolling walker (2 wheels) Transfers: Sit to/from Stand, Bed to chair/wheelchair/BSC Sit to Stand: Contact guard assist     Step pivot transfers: Contact guard assist     General transfer comment: CGA and cues for safety and hand placement      Balance Overall balance assessment: Needs assistance Sitting-balance support: No upper extremity supported, Feet supported Sitting balance-Leahy Scale: Good     Standing balance support: Single extremity supported, Bilateral upper extremity supported, During functional activity, Reliant on assistive device for balance Standing balance-Leahy Scale: Poor                             ADL either performed or assessed with clinical judgement   ADL Overall ADL's : Needs assistance/impaired Eating/Feeding: Set up;Sitting  Grooming: Set up;Sitting   Upper Body Bathing: Contact guard assist;Sitting;Cueing for compensatory techniques   Lower Body Bathing: Minimal assistance;Moderate  assistance;Sitting/lateral leans;Sit to/from stand;Cueing for compensatory techniques;Cueing for safety   Upper Body Dressing : Contact guard assist;Sitting   Lower Body Dressing: Minimal assistance;Sit to/from stand;Cueing for compensatory techniques;Cueing for safety   Toilet Transfer: Contact guard assist;Cueing for safety;Ambulation;BSC/3in1;Rolling walker (2 wheels) Toilet Transfer Details (indicate cue type and reason): simulated at EOB Toileting- Clothing Manipulation and Hygiene: Minimal assistance;Moderate assistance;Sit to/from stand;Cueing for compensatory techniques;Cueing for safety       Functional mobility during ADLs: Contact guard assist;Rolling walker (2 wheels);Cueing for safety General ADL Comments: All while adhering to back precautions. Pt with decreased activity tolerance and pain affecting functional level.     Vision Baseline Vision/History: 1 Wears glasses (bifocals) Ability to See in Adequate Light: 0 Adequate (with glasses) Patient Visual Report: No change from baseline Additional Comments: Depth percention not formally screened or asswessed. However, pt apears to have impaied depth perception on Left side. Although pt is able to see objects on her Lef, pt noted to walk very close to but not run into objects on her Left side durning functional mobility with pt unaware of closeness when encouraged to move to the Right to provide her with increased space for increased safety.     Perception Perception: Not tested (But with mild deficts noted to pt's Left side during functional mobility (see note under Vision - Assessment))       Praxis         Pertinent Vitals/Pain Pain Assessment Pain Assessment: Faces Faces Pain Scale: Hurts even more Pain Location: head (pt reported migraine), back, L hip Pain Descriptors / Indicators: Aching, Discomfort, Grimacing, Guarding, Sore Pain Intervention(s): Limited activity within patient's tolerance, Monitored during  session, Premedicated before session, Repositioned     Extremity/Trunk Assessment Upper Extremity Assessment Upper Extremity Assessment: Right hand dominant;Generalized weakness   Lower Extremity Assessment Lower Extremity Assessment: Defer to PT evaluation   Cervical / Trunk Assessment Cervical / Trunk Assessment: Normal   Communication Communication Communication: Impaired Factors Affecting Communication: Hearing impaired   Cognition Arousal: Alert Behavior During Therapy: WFL for tasks assessed/performed Cognition: Cognition impaired     Awareness: Intellectual awareness intact, Online awareness intact Memory impairment (select all impairments): Working memory Attention impairment (select first level of impairment): Selective attention Executive functioning impairment (select all impairments): Organization, Problem solving OT - Cognition Comments: Pt AAOx4 with cognition largely [facility] for tasks assessed but with deficits noted above. Pt with decreased safety awareness and occasional impulsiveness with movemnt when feeling increased pain or dizziness.                 Following commands: Intact       Cueing  General Comments   Cueing Techniques: Verbal cues;Visual cues;Gestural cues      Exercises Exercises: General Upper Extremity, Other exercises General Exercises - Upper Extremity Shoulder Flexion: AROM, Both, 5 reps, Supine, Strengthening Shoulder Extension: AROM, Both, 5 reps, Supine, Strengthening Other Exercises Other Exercises: Chest press; AROM; Both; 5 reps; Supine; Strengthening   Shoulder Instructions      Home Living Family/patient expects to be discharged to:: Private residence Living Arrangements: Alone Available Help at Discharge: Family;Neighbor;Available 24 hours/day Type of Home: House Home Access: Stairs to enter [employer] of Steps: 1+1 (1 step on to porch and 1 step into house from [employer]) Entrance Stairs-Rails:  Right;Left;Can reach both Home Layout: One level  Bathroom Shower/Tub: Chief Strategy Officer: Standard     Home Equipment: Tub bench;Standard Walker;BSC/3in1   Additional Comments: Pt reports her daughter lives 2 doors down and can assist when home, but works. Pt reports another neighbor who is a Engineer, civil (consulting) and other family members can also provide assistance if 24/7 care is needed at time of discharge.      Prior Functioning/Environment Prior Level of Function : Independent/Modified Independent             Mobility Comments: Typically Independent without an AD, but reports use of walker over the past week due to pain. ADLs Comments: Typically Independent with ADLs and IADLs. Pt reports requiring increased time and efforts over the past week due to pain.    OT Problem List: Decreased strength;Decreased activity tolerance;Impaired balance (sitting and/or standing);Decreased knowledge of use of DME or AE;Decreased knowledge of precautions;Decreased safety awareness;Pain   OT Treatment/Interventions: Self-care/ADL training;Therapeutic exercise;Energy conservation;DME and/or AE instruction;Therapeutic activities;Patient/family education;Balance training      OT Goals(Current goals can be found in the care plan section)   Acute Rehab OT Goals Patient Stated Goal: to return home OT Goal Formulation: With patient Time For Goal Achievement: 08/29/23 Potential to Achieve Goals: Good ADL Goals Pt Will Perform Grooming: with modified independence;standing (while adhering to back precautions) Pt Will Perform Lower Body Bathing: with modified independence;sitting/lateral leans;sit to/from stand (while adhering to back precautions; with adaptive equipment as needed) Pt Will Perform Lower Body Dressing: with modified independence;sitting/lateral leans;sit to/from stand (while adhering to back precautions; with adaptive equipment as needed) Pt Will Transfer to Toilet: with  modified independence;ambulating;regular height toilet (with least restricitve AD, while adhering to sternal precautions) Pt Will Perform Toileting - Clothing Manipulation and hygiene: with modified independence;sitting/lateral leans;sit to/from stand (while adhering to back precautions; with adaptive equipment as needed) Pt Will Perform Tub/Shower Transfer: Tub transfer;with modified independence;ambulating;tub bench (while adhering to back precautions; with least restrictive AD) Pt/caregiver will Perform Home Exercise Program: Increased strength;Both right and left upper extremity;With written HEP provided;Independently (Increased activity tolerance; AROM; adhering to back precautions)   OT Frequency:  Min 2X/week    Co-evaluation PT/OT/SLP Co-Evaluation/Treatment: Yes Reason for Co-Treatment: To address functional/ADL transfers PT goals addressed during session: Mobility/safety with mobility;Balance OT goals addressed during session: ADL's and self-care;Strengthening/ROM      AM-PAC OT 6 Clicks Daily Activity     Outcome Measure Help from another person eating meals?: A Little Help from another person taking care of personal grooming?: A Little Help from another person toileting, which includes using toliet, bedpan, or urinal?: A Little Help from another person bathing (including washing, rinsing, drying)?: A Lot Help from another person to put on and taking off regular upper body clothing?: A Little Help from another person to put on and taking off regular lower body clothing?: A Lot 6 Click Score: 16   End of Session Equipment Utilized During Treatment: Gait belt;Rolling walker (2 wheels) Nurse Communication: Mobility status  Activity Tolerance: Patient tolerated treatment well Patient left: in bed;with call bell/phone within reach;with bed alarm set  OT Visit Diagnosis: Unsteadiness on feet (R26.81);Other abnormalities of gait and mobility (R26.89);Pain;Other (comment)  (decreased activity tolerance)                Time: 4098-1191 OT Time Calculation (min): 32 min Charges:  OT General Charges $OT Visit: 1 Visit OT Evaluation $OT Eval Low Complexity: 1 Low  Ronnell Coins., OTR/L, MA Acute Rehab [phone]   Angelyn Barges  Sahian Kerney 08/15/2023, 1:37 PM

## 2023-08-15 NOTE — Plan of Care (Signed)
   Problem: Education: Goal: Knowledge of General Education information will improve Description: Including pain rating scale, medication(s)/side effects and non-pharmacologic comfort measures Outcome: Progressing   Problem: Activity: Goal: Risk for activity intolerance will decrease Outcome: Progressing

## 2023-08-15 NOTE — Progress Notes (Signed)
   Providing Compassionate, Quality Care - Together   Subjective: Patient reports she has a migraine this morning and doesn't feel up to discharge today.  Objective: Vital signs in last 24 hours: Temp:  [97.5 F (36.4 C)-98.3 F (36.8 C)] 97.5 F (36.4 C) (06/11 0844) Pulse Rate:  [72-92] 79 (06/11 0844) Resp:  [11-21] 18 (06/11 0523) BP: (119-144)/(52-125) 143/57 (06/11 0844) SpO2:  [91 %-100 %] 98 % (06/11 0844)  Intake/Output from previous day: 06/10 0701 - 06/11 0700 In: 326.9 [I.V.:40.5; IV Piggyback:286.4] Out: 350 [Urine:350] Intake/Output this shift: No intake/output data recorded.  Alert and oriented x 4 PERRLA CN II-XII grossly intact MAE, Strength and sensation intact Incision is covered with Honeycomb dressing and Steri Strips; Dressing is clean, dry, and intact   Lab Results: Recent Labs    08/13/23 0558 08/13/23 1721 08/14/23 0122 08/14/23 0443  WBC 10.8*  --   --  8.1  HGB 13.5   < > 11.2* 11.1*  HCT 41.3   < > 33.0* 34.2*  PLT 376  --   --  253   < > = values in this interval not displayed.   BMET Recent Labs    08/14/23 0443 08/15/23 0611  NA 140 140  K 3.8 4.1  CL 110 106  CO2 19* 26  GLUCOSE 195* 229*  BUN 23 18  CREATININE 0.83 0.79  CALCIUM 8.3* 8.7*    Studies/Results: DG Lumbar Spine Complete Result Date: 08/13/2023 CLINICAL DATA:  Elective surgery. EXAM: LUMBAR SPINE - COMPLETE 4+ VIEW COMPARISON:  CT lumbar spine 08/11/2023 FINDINGS: Four intraoperative views lumbar spine. Initial view demonstrates a surgical instrument over the L2/L3 vertebral bodies. Second film demonstrates blunt tip probe at the L3 vertebral body. Third film demonstrates a blunt tip probe at the posterior aspect of the L4 vertebral body. Fourth view demonstrates a curved tip probe at the L3-L4 disc space. IMPRESSION: Intraoperative views as above. Electronically Signed   By: Deboraha Fallow M.D.   On: 08/13/2023 20:20    Assessment/Plan: Patient underwent  L3-4 far lateral intervertebral discectomy by Dr. Larrie Po.   LOS: 2 days   -Plan is for discharge home with home health PT and OT, but patient is not comfortable with this. She would feel more comfortable going to outpatient therapies. -Hopefully, patient's pain will be better controlled over the next 24 hours and patient can discharge home.  I am in communication with my attending and they agree with the plan for this patient.   Henreitta Locus, DNP, AGNP-C Nurse Practitioner  [facility] [address], [place], [place] [address] P: [phone]    F: [phone]  08/15/2023, 10:48 AM

## 2023-08-15 NOTE — Progress Notes (Addendum)
  Progress Note   Patient: Priscilla Adams:[id number] DOB: [birth year] DOA: 08/11/2023     2 DOS: the patient was seen and examined on 08/15/2023 Assessment and Plan:  Chronic back pain  S/p L3-4 discectomy   Acute postoperative respiratory insufficiency Patient remained intubated postoperatively, but was extubated the following day without complication. She was briefly on vasopressin.  She was started on IV steroids for possible anaphylactic reaction.   Continue steroids  F/u tryptase  Pain control per NSGY   HTN Some low blood pressures requiring pressors briefly Now normotensive to hypertensive.  Continue anti hypertensives   HLD Noted, continue statin       Subjective: migrainous headache. Tolerable back pain.     Physical Exam: Vitals:   08/14/23 1800 08/14/23 2243 08/15/23 0523 08/15/23 0844  BP: (!) 137/55 (!) 119/57 139/60 (!) 143/57  Pulse: 80 75 85 79  Resp: 13 16 18    Temp:  97.6 F (36.4 C) 97.6 F (36.4 C) (!) 97.5 F (36.4 C)  TempSrc:  Oral Oral   SpO2: 95% 100% 97% 98%  Weight:      Height:       Physical Exam  Constitutional: In mild distress.  Cardiovascular: Normal rate, regular rhythm. No lower extremity edema  Pulmonary: Non labored breathing on room air, no wheezing or rales.   Abdominal: Soft. Normal bowel sounds. Non distended and non tender Musculoskeletal: Lower back surgical dressing in place. No purulent drainage noted.  Neurological: Alert and oriented to person, place, and time. Non focal  Skin: Skin is warm and dry.   Data Reviewed:     Latest Ref Rng & Units 08/15/2023    6:11 AM 08/14/2023    4:43 AM 08/14/2023    1:22 AM  BMP  Glucose 70 - 99 mg/dL 811  914    BUN 8 - 23 mg/dL 18  23    Creatinine 7.82 - 1.00 mg/dL 9.56  2.13    Sodium 086 - 145 mmol/L 140  140  141   Potassium 3.5 - 5.1 mmol/L 4.1  3.8  3.9   Chloride 98 - 111 mmol/L 106  110    CO2 22 - 32 mmol/L 26  19    Calcium 8.9 - 10.3 mg/dL 8.7  8.3        Family Communication: Discussed plan at length with patient. She noted understanding.   Disposition: Status is: Inpatient Remains inpatient appropriate because: pain   Planned Discharge Destination: Pending    Time spent: 35 minutes  Author: Joette Mustard, MD 08/15/2023 8:49 AM  For on call review [email or web address].

## 2023-08-15 NOTE — Progress Notes (Signed)
 Physical Therapy Treatment Patient Details Name: Priscilla Adams MRN: [id number] DOB: 23-Nov-1950 Today's Date: 08/15/2023   History of Present Illness Pt is 73 yo female who presents on 08/11/23 with severe pain from L hip down through knee. MRI reveals bilateral L4-L5 lateral recess stenosis and a far lateral herniated disc at L3-4. S/p discectomy using micro-dissection 6/9. PMH: DM2, HTN    PT Comments  Pt received in supine and agreeable to session. Pt reports having a migraine, which causes intermittent dizziness. Pt is able to complete bed mobility with cues and up to min A to perform logroll technique. Pt requires increased cues for safety and precautions when returning to supine due to pt laying back quickly due to dizziness. Pt able to tolerate increased gait distance this session with CGA and a chair follow for safety. Pt demonstrates improved stability with RW this session, however requires UE support for balance. Pt continues to benefit from PT services to progress toward functional mobility goals.    If plan is discharge home, recommend the following: Assistance with cooking/housework;Assist for transportation;Help with stairs or ramp for entrance;A lot of help with walking and/or transfers;A lot of help with bathing/dressing/bathroom;Supervision due to cognitive status;Direct supervision/assist for medications management;Direct supervision/assist for financial management   Can travel by private vehicle        Equipment Recommendations  None recommended by PT    Recommendations for Other Services       Precautions / Restrictions Precautions Precautions: Back Precaution Booklet Issued: Yes (comment) Recall of Precautions/Restrictions: Impaired Precaution/Restrictions Comments: needs reminding of precautions Restrictions Weight Bearing Restrictions Per Provider Order: No     Mobility  Bed Mobility Overal bed mobility: Needs Assistance Bed Mobility: Rolling, Sidelying to Sit,  Sit to Sidelying Rolling: Contact guard assist, Used rails Sidelying to sit: Min assist, Used rails     Sit to sidelying: Min assist, Used rails General bed mobility comments: Pt requring cues for technique and safety with pt initially flopping back on bed when transitioning to supine due to pt reported dizzines.    Transfers Overall transfer level: Needs assistance Equipment used: Rolling walker (2 wheels) Transfers: Sit to/from Stand Sit to Stand: Contact guard assist           General transfer comment: CGA and cues for safety and hand placement    Ambulation/Gait Ambulation/Gait assistance: Contact guard assist, +2 safety/equipment Gait Distance (Feet): 200 Feet Assistive device: Rolling walker (2 wheels) Gait Pattern/deviations: Step-through pattern, Trunk flexed, Decreased stride length Gait velocity: decreased     General Gait Details: Cues for upright posture and RW proximity. Good pace and stability with RW support. CGA with a +2 for chair follow for safety due to pt reporting intermittent dizziness with migraine   Stairs             Wheelchair Mobility     Tilt Bed    Modified Rankin (Stroke Patients Only)       Balance Overall balance assessment: Needs assistance Sitting-balance support: No upper extremity supported, Feet supported Sitting balance-Leahy Scale: Good     Standing balance support: Single extremity supported, Bilateral upper extremity supported, During functional activity, Reliant on assistive device for balance Standing balance-Leahy Scale: Poor Standing balance comment: with RW support                            Communication Communication Communication: Impaired Factors Affecting Communication: Hearing impaired  Cognition Arousal: Alert Behavior During  Therapy: WFL for tasks assessed/performed   PT - Cognitive impairments: Memory, Problem solving, Safety/Judgement                         Following  commands: Intact      Cueing Cueing Techniques: Verbal cues, Visual cues, Gestural cues  Exercises General Exercises - Lower Extremity Ankle Circles/Pumps: AROM, Supine, Both, 5 reps Heel Slides: AROM, Supine, Both, 5 reps Hip ABduction/ADduction: AROM, Supine, Both, 5 reps    General Comments        Pertinent Vitals/Pain Pain Assessment Pain Assessment: Faces Faces Pain Scale: Hurts even more Pain Location: head (pt reported migraine), back, L hip Pain Descriptors / Indicators: Aching, Discomfort, Grimacing, Guarding, Sore Pain Intervention(s): Limited activity within patient's tolerance, Monitored during session, Repositioned, Premedicated before session     PT Goals (current goals can now be found in the care plan section) Acute Rehab PT Goals Patient Stated Goal: return home, decreased pain PT Goal Formulation: With patient Time For Goal Achievement: 08/27/23 Progress towards PT goals: Progressing toward goals    Frequency    Min 2X/week           Co-evaluation PT/OT/SLP Co-Evaluation/Treatment: Yes Reason for Co-Treatment: To address functional/ADL transfers PT goals addressed during session: Mobility/safety with mobility;Balance OT goals addressed during session: ADL's and self-care;Strengthening/ROM      AM-PAC PT 6 Clicks Mobility   Outcome Measure  Help needed turning from your back to your side while in a flat bed without using bedrails?: A Little Help needed moving from lying on your back to sitting on the side of a flat bed without using bedrails?: A Little Help needed moving to and from a bed to a chair (including a wheelchair)?: A Little Help needed standing up from a chair using your arms (e.g., wheelchair or bedside chair)?: A Little Help needed to walk in hospital room?: A Little Help needed climbing 3-5 steps with a railing? : A Lot 6 Click Score: 17    End of Session Equipment Utilized During Treatment: Gait belt Activity Tolerance:  Patient tolerated treatment well Patient left: in bed;with call bell/phone within reach Nurse Communication: Mobility status PT Visit Diagnosis: Pain;Difficulty in walking, not elsewhere classified (R26.2)     Time: 6578-4696 PT Time Calculation (min) (ACUTE ONLY): 32 min  Charges:    $Gait Training: 8-22 mins PT General Charges $$ ACUTE PT VISIT: 1 Visit                     Michaelle Adolphus, PTA Acute Rehabilitation Services Secure Chat Preferred  [phone]    Michaelle Adolphus 08/15/2023, 1:37 PM

## 2023-08-15 NOTE — Progress Notes (Signed)
 OT Cancellation Note  Patient Details Name: Priscilla Adams MRN: [id number] DOB: Jan 27, 1951   Cancelled Treatment:    Reason Eval/Treat Not Completed: Other (comment);Pain limiting ability to participate (Pt declining participation in OT eval at this time due to severe headache. Pt received medication for headache just a few minutes ago and requests OT reattempt after medicine kicks in. OT to reattempt to see pt at a later time as available/appropriate.)  Ronnell Coins., OTR/L, MA Acute Rehab [phone]  Walt Gunner 08/15/2023, 9:59 AM

## 2023-08-16 DIAGNOSIS — R52 Pain, unspecified: Secondary | ICD-10-CM | POA: Diagnosis not present

## 2023-08-16 LAB — GLUCOSE, CAPILLARY
Glucose-Capillary: 204 mg/dL — ABNORMAL HIGH (ref 70–99)
Glucose-Capillary: 210 mg/dL — ABNORMAL HIGH (ref 70–99)
Glucose-Capillary: 213 mg/dL — ABNORMAL HIGH (ref 70–99)
Glucose-Capillary: 221 mg/dL — ABNORMAL HIGH (ref 70–99)
Glucose-Capillary: 243 mg/dL — ABNORMAL HIGH (ref 70–99)
Glucose-Capillary: 300 mg/dL — ABNORMAL HIGH (ref 70–99)

## 2023-08-16 MED ORDER — INSULIN ASPART 100 UNIT/ML IJ SOLN
3.0000 [IU] | Freq: Three times a day (TID) | INTRAMUSCULAR | Status: AC
  Administered 2023-08-16: 3 [IU] via SUBCUTANEOUS

## 2023-08-16 MED ORDER — INSULIN GLARGINE-YFGN 100 UNIT/ML ~~LOC~~ SOLN
7.0000 [IU] | Freq: Every day | SUBCUTANEOUS | Status: DC
  Administered 2023-08-16: 7 [IU] via SUBCUTANEOUS
  Filled 2023-08-16 (×2): qty 0.07

## 2023-08-16 NOTE — Progress Notes (Signed)
 Physical Therapy Treatment Patient Details Name: Priscilla Adams MRN: [id number] DOB: 01-01-1951 Today's Date: 08/16/2023   History of Present Illness Pt is 73 yo female who presents on 08/11/23 with severe pain from L hip down through knee. MRI reveals bilateral L4-L5 lateral recess stenosis and a far lateral herniated disc at L3-4. S/p discectomy using micro-dissection 6/9. PMH: DM2, HTN    PT Comments  Pt received in supine and agreeable to session. Pt demonstrates good carryover of logroll technique from previous session. Pt able to tolerate gait and stair trials with CGA for safety. Pt requires min A to stand from low surface. Discussed home set up for safe navigation and equipment use with pt and pt's daughter. Pt's daughter reports plan to build a ramp to enter the pt's home. Pt continues to benefit from PT services to progress toward functional mobility goals.     If plan is discharge home, recommend the following: Assistance with cooking/housework;Assist for transportation;Help with stairs or ramp for entrance;A lot of help with walking and/or transfers;A lot of help with bathing/dressing/bathroom;Supervision due to cognitive status;Direct supervision/assist for medications management;Direct supervision/assist for financial management   Can travel by private vehicle        Equipment Recommendations  None recommended by PT    Recommendations for Other Services       Precautions / Restrictions Precautions Precautions: Back Precaution Booklet Issued: Yes (comment) Recall of Precautions/Restrictions: Impaired Precaution/Restrictions Comments: needs reminding of precautions Restrictions Weight Bearing Restrictions Per Provider Order: No     Mobility  Bed Mobility Overal bed mobility: Needs Assistance Bed Mobility: Rolling, Sidelying to Sit Rolling: Contact guard assist, Used rails Sidelying to sit: Contact guard assist, Used rails       General bed mobility comments: cues for  technique and CGA for safety    Transfers Overall transfer level: Needs assistance Equipment used: Rolling walker (2 wheels) Transfers: Sit to/from Stand Sit to Stand: Min assist           General transfer comment: Min A for power up from low EOB and cues for hand placement    Ambulation/Gait Ambulation/Gait assistance: Contact guard assist Gait Distance (Feet): 200 Feet Assistive device: Rolling walker (2 wheels) Gait Pattern/deviations: Step-through pattern, Trunk flexed, Decreased stride length Gait velocity: decreased     General Gait Details: cues for upright posture and CGA for safety   Stairs Stairs: Yes Stairs assistance: Contact guard assist Stair Management: Forwards, One rail Right Number of Stairs: 2 General stair comments: Cues for safety and technique. CGA for safety   Wheelchair Mobility     Tilt Bed    Modified Rankin (Stroke Patients Only)       Balance Overall balance assessment: Needs assistance Sitting-balance support: No upper extremity supported, Feet supported Sitting balance-Leahy Scale: Good     Standing balance support: Bilateral upper extremity supported, During functional activity, Reliant on assistive device for balance Standing balance-Leahy Scale: Poor Standing balance comment: with RW support                            Communication Communication Communication: Impaired Factors Affecting Communication: Hearing impaired  Cognition Arousal: Alert Behavior During Therapy: WFL for tasks assessed/performed   PT - Cognitive impairments: Memory, Problem solving, Safety/Judgement                         Following commands: Intact      Cueing  Cueing Techniques: Verbal cues, Visual cues, Gestural cues  Exercises      General Comments        Pertinent Vitals/Pain Pain Assessment Pain Assessment: Faces Faces Pain Scale: Hurts a little bit Pain Location: head (pt reported migraine), back Pain  Descriptors / Indicators: Aching, Discomfort, Guarding, Sore Pain Intervention(s): Limited activity within patient's tolerance, Monitored during session, Repositioned     PT Goals (current goals can now be found in the care plan section) Acute Rehab PT Goals Patient Stated Goal: return home, decreased pain PT Goal Formulation: With patient Time For Goal Achievement: 08/27/23 Progress towards PT goals: Progressing toward goals    Frequency    Min 2X/week       AM-PAC PT 6 Clicks Mobility   Outcome Measure  Help needed turning from your back to your side while in a flat bed without using bedrails?: A Little Help needed moving from lying on your back to sitting on the side of a flat bed without using bedrails?: A Little Help needed moving to and from a bed to a chair (including a wheelchair)?: A Little Help needed standing up from a chair using your arms (e.g., wheelchair or bedside chair)?: A Little Help needed to walk in hospital room?: A Little Help needed climbing 3-5 steps with a railing? : A Little 6 Click Score: 18    End of Session Equipment Utilized During Treatment: Gait belt Activity Tolerance: Patient tolerated treatment well Patient left: in chair;with family/visitor present;with call bell/phone within reach Nurse Communication: Mobility status PT Visit Diagnosis: Pain;Difficulty in walking, not elsewhere classified (R26.2)     Time: 4782-9562 PT Time Calculation (min) (ACUTE ONLY): 35 min  Charges:    $Gait Training: 8-22 mins $Therapeutic Activity: 8-22 mins PT General Charges $$ ACUTE PT VISIT: 1 Visit                     Michaelle Adolphus, PTA Acute Rehabilitation Services Secure Chat Preferred  [phone]    Michaelle Adolphus 08/16/2023, 12:51 PM

## 2023-08-16 NOTE — TOC CM/SW Note (Signed)
 Met with pt's daughter, Sheryle Donning, per her request. Pt lives alone. Her daughter lives nearby. The DC plan is for pt to return home with the support of her daughter. Daughter will provide transportation at time of DC. Discussed HH and DME. Daughter reports that her mother prefers outpt rehab. Pt doesn't like to have strangers in her house. She prefers [facility] in [place]. She reports that she is able to take her mom to the therapy. Pt has DME (2 RW, cane, and a tub chair). Discussed outpt rehab with Angelito, Charity fundraiser. We will continue to f/u and assist with outpt rehab if MD agrees.

## 2023-08-16 NOTE — Plan of Care (Signed)

## 2023-08-16 NOTE — Care Management Important Message (Signed)
 Important Message  Patient Details  Name: Priscilla Adams MRN: [id number] Date of Birth: 1950-09-27   Important Message Given:  Yes - Medicare IM     Felix Host 08/16/2023, 12:17 PM

## 2023-08-16 NOTE — Inpatient Diabetes Management (Addendum)
 Inpatient Diabetes Program Recommendations  AACE/ADA: New Consensus Statement on Inpatient Glycemic Control (2015)  Target Ranges:  Prepandial:   less than 140 mg/dL      Peak postprandial:   less than 180 mg/dL (1-2 hours)      Critically ill patients:  140 - 180 mg/dL   Lab Results  Component Value Date   GLUCAP 210 (H) 08/16/2023   HGBA1C 6.8 (H) 08/12/2023    Review of Glycemic Control  Latest Reference Range & Units 08/15/23 05:27 08/15/23 06:22 08/15/23 11:52 08/15/23 15:45 08/15/23 19:34 08/16/23 00:08 08/16/23 04:12 08/16/23 08:38  Glucose-Capillary 70 - 99 mg/dL 782 (H) 956 (H) 213 (H) 327 (H) 289 (H) 221 (H) 300 (H) 210 (H)   Diabetes history: DM 2 Outpatient Diabetes medications: metformin 1000 mg bid, Ozempic 0.5 mg Qfriday Current orders for Inpatient glycemic control:  Semglee 7 units qhs Novolog 3 units tid meal coverage Novolog 0-9 units Q4 hours  Note: Hyperglycemia this am. Steroids complete and are no longer ordered. Semglee started and meal coverage added today. Watch trends and may need to be discontinued for tomorrow.  Thanks,  Eloise Hake RN, MSN, BC-ADM Inpatient Diabetes Coordinator Team Pager [phone] (8a-5p)

## 2023-08-16 NOTE — Progress Notes (Addendum)
  Progress Note   Patient: Priscilla Adams JXB:[id number] DOB: Dec 30, 1950 DOA: 08/11/2023     3 DOS: the patient was seen and examined on 08/16/2023 Assessment and Plan:  Chronic back pain  S/p L3-4 discectomy   Acute postoperative respiratory insufficiency Resolved  Patient remained intubated postoperatively, but was extubated the following day without complication. She was briefly on vasopressin.  She was started on IV steroids for possible anaphylactic reaction.   S/p steroid course  tryptase WNL  Pain control per NSGY   HTN Some low blood pressures requiring pressors briefly Now normotensive to hypertensive.  Continue anti hypertensives  Regimen can be titrated outpatient.   HLD Noted, continue statin   T2DM Well controlled for age. A1c 6.8.  Elevated blood glucoses in the setting of steroid use.  Semglee while inpatient. + SSI.  Discharge on home metformin and ozempic   CBG (last 3)  Recent Labs    08/16/23 0838 08/16/23 1157 08/16/23 1623  GLUCAP 210* 243* 204*    Plan is for patient to discharge per primary team.  Thank you for allowing our team to take part in this patient;s care.      Subjective: Some mild migrainous headache. Feels this has improved.     Physical Exam: Vitals:   08/15/23 1936 08/16/23 0408 08/16/23 0827 08/16/23 1410  BP: 137/68 (!) 147/67 (!) 150/69 (!) 150/76  Pulse: 95 85 80 80  Resp: 17 16  18   Temp: 98.3 F (36.8 C) 98 F (36.7 C) 98 F (36.7 C) 98.1 F (36.7 C)  TempSrc:   Oral Oral  SpO2: 96% 96% 100% 100%  Weight:      Height:        Physical Exam  Constitutional: In no distress. Sitting up in her chair.  Cardiovascular: Normal rate, regular rhythm. No lower extremity edema  Pulmonary: Non labored breathing on room air, no wheezing or rales.   Abdominal: Soft. Normal bowel sounds. Non distended and non tender Musculoskeletal: Normal range of motion.     Neurological: Alert and oriented to person, place, and time.  Non focal  Skin: Skin is warm and dry.   Data Reviewed:     Latest Ref Rng & Units 08/15/2023    6:11 AM 08/14/2023    4:43 AM 08/14/2023    1:22 AM  BMP  Glucose 70 - 99 mg/dL 130  865    BUN 8 - 23 mg/dL 18  23    Creatinine 7.84 - 1.00 mg/dL 6.96  2.95    Sodium 284 - 145 mmol/L 140  140  141   Potassium 3.5 - 5.1 mmol/L 4.1  3.8  3.9   Chloride 98 - 111 mmol/L 106  110    CO2 22 - 32 mmol/L 26  19    Calcium 8.9 - 10.3 mg/dL 8.7  8.3       Family Communication: Discussed plan at length with patient. She noted understanding.   Disposition: Status is: Inpatient Remains inpatient appropriate because: pain   Planned Discharge Destination: Pending    Time spent: 35 minutes  Author: Joette Mustard, MD 08/16/2023 6:40 PM  For on call review [email or web address].

## 2023-08-16 NOTE — Progress Notes (Signed)
 Subjective: The patient complains of pain all over the headache, etc.  She does not feel she is ready to go home.  Objective: Vital signs in last 24 hours: Temp:  [98 F (36.7 C)-98.3 F (36.8 C)] 98 F (36.7 C) (06/12 0827) Pulse Rate:  [80-99] 80 (06/12 0827) Resp:  [16-17] 16 (06/12 0408) BP: (137-150)/(67-84) 150/69 (06/12 0827) SpO2:  [96 %-100 %] 100 % (06/12 0827) Estimated body mass index is 28.93 kg/m as calculated from the following:   Height as of this encounter: 5' 8 (1.727 m).   Weight as of this encounter: 86.3 kg.   Intake/Output from previous day: No intake/output data recorded. Intake/Output this shift: No intake/output data recorded.  Physical exam the patient is alert and pleasant.  Her strength is normal.  Lab Results: Recent Labs    08/14/23 0122 08/14/23 0443  WBC  --  8.1  HGB 11.2* 11.1*  HCT 33.0* 34.2*  PLT  --  253   BMET Recent Labs    08/14/23 0443 08/15/23 0611  NA 140 140  K 3.8 4.1  CL 110 106  CO2 19* 26  GLUCOSE 195* 229*  BUN 23 18  CREATININE 0.83 0.79  CALCIUM 8.3* 8.7*    Studies/Results: No results found.  Assessment/Plan: Postop day #3: I encouraged the patient to mobilize.  We discussed discharge to home versus to a skilled nursing facility.  She is not interested in a skilled nursing facility and wants to go home tomorrow.  We discussed a head CT for her headaches.  She tells me she has had these chronically without change and has been diagnosed with migraines in the past.  LOS: 3 days     Elder Greening 08/16/2023, 10:41 AM     Patient ID: Priscilla Adams, female   DOB: [birth year], 73 y.o.   MRN: [id number]

## 2023-08-16 NOTE — Plan of Care (Signed)
  Problem: Activity: Goal: Risk for activity intolerance will decrease Outcome: Adequate for Discharge   Problem: Nutrition: Goal: Adequate nutrition will be maintained Outcome: Adequate for Discharge   Problem: Coping: Goal: Level of anxiety will decrease Outcome: Adequate for Discharge   Problem: Elimination: Goal: Will not experience complications related to bowel motility Outcome: Adequate for Discharge   Problem: Pain Managment: Goal: General experience of comfort will improve and/or be controlled Outcome: Adequate for Discharge   Problem: Activity: Goal: Risk for activity intolerance will decrease Outcome: Adequate for Discharge   Problem: Nutrition: Goal: Adequate nutrition will be maintained Outcome: Adequate for Discharge   Problem: Coping: Goal: Level of anxiety will decrease Outcome: Adequate for Discharge   Problem: Elimination: Goal: Will not experience complications related to bowel motility Outcome: Adequate for Discharge   Problem: Pain Managment: Goal: General experience of comfort will improve and/or be controlled Outcome: Adequate for Discharge

## 2023-08-17 LAB — GLUCOSE, CAPILLARY
Glucose-Capillary: 180 mg/dL — ABNORMAL HIGH (ref 70–99)
Glucose-Capillary: 139 mg/dL — ABNORMAL HIGH (ref 70–99)
Glucose-Capillary: 120 mg/dL — ABNORMAL HIGH (ref 70–99)

## 2023-08-17 MED ORDER — DOCUSATE SODIUM 100 MG PO CAPS: 100.0000 mg | ORAL_CAPSULE | Freq: Two times a day (BID) | ORAL | 0 refills | Status: AC

## 2023-08-17 MED ORDER — OXYCODONE-ACETAMINOPHEN 5-325 MG PO TABS
1.0000 | ORAL_TABLET | ORAL | Status: DC | PRN
  Administered 2023-08-17: 1 via ORAL
  Filled 2023-08-17: qty 1

## 2023-08-17 MED ORDER — METHOCARBAMOL 500 MG PO TABS
500.0000 mg | ORAL_TABLET | Freq: Four times a day (QID) | ORAL | 0 refills | Status: AC | PRN
Start: 2023-08-17 — End: ?

## 2023-08-17 MED ORDER — OXYCODONE-ACETAMINOPHEN 5-325 MG PO TABS: 1.0000 | ORAL_TABLET | ORAL | 0 refills | Status: AC | PRN

## 2023-08-17 NOTE — Plan of Care (Signed)

## 2023-08-17 NOTE — Discharge Summary (Signed)
 Physician Discharge Summary  Patient ID: Priscilla Adams MRN: [id number] DOB/AGE: 1950/04/24 73 y.o.  Admit date: 08/11/2023 Discharge date: 08/17/2023  Admission Diagnoses: Lumbar herniated disc, lumbar radiculopathy  Discharge Diagnoses: The same Principal Problem:   Intractable pain Active Problems:   Diabetes mellitus without complication (HCC)   Hypertension   Lumbar radiculopathy   Lumbar herniated disc   Acute respiratory insufficiency, postoperative   Discharged Condition: fair  Hospital Course: I performed a left L3-4 discectomy on the patient on 08/13/2023.  The surgery went well.  The patient was slow to mobilize postoperatively.  We had her seen by PT.  By 08/17/2023 the patient was ready for discharge to home.  She was given verbal and written discharge instructions.  Arrangements were made for home physical therapy.  All her questions were answered.  Consults: PT, care management Significant Diagnostic Studies: Lumbar MRI Treatments: Left L3-4 discectomy Discharge Exam: Blood pressure 133/68, pulse 77, temperature 97.9 F (36.6 C), resp. rate 18, height 5' 8 (1.727 m), weight 86.3 kg, SpO2 98%. The patient is alert and pleasant.  Her strength is normal.  Her wound is healing well.  Disposition: Home  Discharge Instructions     Call MD for:  difficulty breathing, headache or visual disturbances   Complete by: As directed    Call MD for:  extreme fatigue   Complete by: As directed    Call MD for:  hives   Complete by: As directed    Call MD for:  persistant dizziness or light-headedness   Complete by: As directed    Call MD for:  persistant nausea and vomiting   Complete by: As directed    Call MD for:  redness, tenderness, or signs of infection (pain, swelling, redness, odor or green/yellow discharge around incision site)   Complete by: As directed    Call MD for:  severe uncontrolled pain   Complete by: As directed    Call MD for:  temperature >100.4    Complete by: As directed    Diet - low sodium heart healthy   Complete by: As directed    Discharge instructions   Complete by: As directed    Call [phone] for a followup appointment. Take a stool softener while you are using pain medications.   Driving Restrictions   Complete by: As directed    Do not drive for 2 weeks.   Increase activity slowly   Complete by: As directed    Lifting restrictions   Complete by: As directed    Do not lift more than 5 pounds. No excessive bending or twisting.   May shower / Bathe   Complete by: As directed    Remove the dressing for 3 days after surgery.  You may shower, but leave the incision alone.   No dressing needed   Complete by: As directed       Allergies as of 08/17/2023       Reactions   Contrast Media [iodinated Contrast Media] Shortness Of Breath   Patient reports she has had a reaction x40 years ago   Penicillins Anaphylaxis, Rash   Told that her mouth was swollen and she couldn't breath and told by MD that she shouldn't take it again.   Rocuronium  Anaphylaxis   Pt with hypotension and erythema post induction for Lumbar Laminectomy surgery on 6/9. Unclear etiology  but Rocuronium  was one of the administered agents.Tryptase level sent and pending.    Aspirin    Burns stomach  Medication List     STOP taking these medications    acetaminophen -codeine  300-30 MG tablet Commonly known as: TYLENOL  #3       TAKE these medications    atorvastatin  40 MG tablet Commonly known as: LIPITOR   docusate sodium  100 MG capsule Commonly known as: COLACE Take 1 capsule (100 mg total) by mouth 2 (two) times daily.   gabapentin  100 MG capsule Commonly known as: NEURONTIN  Take 100 mg by mouth 3 (three) times daily.   metFORMIN 1000 MG tablet Commonly known as: GLUCOPHAGE Take 1,000 mg by mouth 2 (two) times daily with a meal.   methocarbamol  500 MG tablet Commonly known as: ROBAXIN  Take 1 tablet (500 mg total) by  mouth every 6 (six) hours as needed for muscle spasms.   methylPREDNISolone  4 MG Tbpk tablet Commonly known as: MEDROL  DOSEPAK Take by mouth as directed.   naproxen  500 MG tablet Commonly known as: NAPROSYN  Take 1 tablet (500 mg total) by mouth 2 (two) times daily as needed for moderate pain.   olmesartan 20 MG tablet Commonly known as: BENICAR Take 20 mg by mouth daily.   oxyCODONE -acetaminophen  5-325 MG tablet Commonly known as: PERCOCET/ROXICET Take 1-2 tablets by mouth every 4 (four) hours as needed for moderate pain (pain score 4-6).   Ozempic (0.25 or 0.5 MG/DOSE) 2 MG/3ML Sopn Generic drug: Semaglutide(0.25 or 0.5MG /DOS) Inject 0.5 mg into the skin every Friday.   trimethoprim -polymyxin b  ophthalmic solution Commonly known as: POLYTRIM  Place 1 drop into the left eye in the morning, at noon, in the evening, and at bedtime. For 2 days after each monthly eye injection               Discharge Care Instructions  (From admission, onward)           Start     Ordered   08/17/23 0000  No dressing needed        08/17/23 0653            Follow-up Information     Care, [facility] Follow up.   Specialty: Home Health Services Why: Priscilla Adams will contact you within 48 hours of discharge to  arrange a home health visiti Contact information: [address] [place] [address] [phone]                 Signed: Elder Greening 08/17/2023, 6:54 AM

## 2023-08-20 ENCOUNTER — Telehealth: Payer: Self-pay

## 2023-08-20 NOTE — Transitions of Care (Post Inpatient/ED Visit) (Signed)
   08/20/2023  Name: Priscilla Adams MRN: [id number] DOB: Mar 17, 1950  Today's TOC FU Call Status: Today's TOC FU Call Status:: Unsuccessful Call (1st Attempt) Unsuccessful Call (1st Attempt) Date: 08/20/23  Attempted to reach the patient regarding the most recent Inpatient/ED visit.  Follow Up Plan: No further outreach attempts will be made at this time. We have been unable to contact the patient.  Laquisha Northcraft J. Trudee Chirino RN, MSN [facility], [facility] RN Care Manager Direct Dial: [phone]  Fax: [phone] Website: [email or web address]

## 2023-08-21 ENCOUNTER — Telehealth: Payer: Self-pay

## 2023-08-21 ENCOUNTER — Other Ambulatory Visit: Payer: Self-pay

## 2023-08-21 ENCOUNTER — Emergency Department (HOSPITAL_COMMUNITY)
Admission: EM | Admit: 2023-08-21 | Discharge: 2023-08-21 | Discharge status: Against Medical Advice | Attending: Emergency Medicine | Admitting: Emergency Medicine

## 2023-08-21 ENCOUNTER — Encounter (HOSPITAL_COMMUNITY): Payer: Self-pay

## 2023-08-21 DIAGNOSIS — Z5321 Procedure and treatment not carried out due to patient leaving prior to being seen by health care provider: Secondary | ICD-10-CM | POA: Insufficient documentation

## 2023-08-21 DIAGNOSIS — R531 Weakness: Secondary | ICD-10-CM | POA: Diagnosis not present

## 2023-08-21 DIAGNOSIS — R197 Diarrhea, unspecified: Secondary | ICD-10-CM | POA: Diagnosis not present

## 2023-08-21 DIAGNOSIS — R519 Headache, unspecified: Secondary | ICD-10-CM | POA: Diagnosis not present

## 2023-08-21 DIAGNOSIS — R109 Unspecified abdominal pain: Secondary | ICD-10-CM | POA: Diagnosis not present

## 2023-08-21 LAB — COMPREHENSIVE METABOLIC PANEL WITH GFR
ALT: 8 U/L (ref 0–44)
AST: 15 U/L (ref 15–41)
Albumin: 3.5 g/dL (ref 3.5–5.0)
Alkaline Phosphatase: 71 U/L (ref 38–126)
Anion gap: 14 (ref 5–15)
BUN: 11 mg/dL (ref 8–23)
CO2: 26 mmol/L (ref 22–32)
Calcium: 9.1 mg/dL (ref 8.9–10.3)
Chloride: 102 mmol/L (ref 98–111)
Creatinine, Ser: 0.72 mg/dL (ref 0.44–1.00)
GFR, Estimated: >60 mL/min (ref >60)
Glucose, Bld: 209 mg/dL — ABNORMAL HIGH (ref 70–99)
Potassium: 3.1 mmol/L — ABNORMAL LOW (ref 3.5–5.1)
Sodium: 142 mmol/L (ref 135–145)
Total Bilirubin: 1.1 mg/dL (ref 0.0–1.2)
Total Protein: 6.7 g/dL (ref 6.5–8.1)

## 2023-08-21 LAB — CBC
HCT: 39.9 % (ref 36.0–46.0)
Hemoglobin: 12.6 g/dL (ref 12.0–15.0)
MCH: 28.6 pg (ref 26.0–34.0)
MCHC: 31.6 g/dL (ref 30.0–36.0)
MCV: 90.7 fL (ref 80.0–100.0)
Platelets: 293 10*3/uL (ref 150–400)
RBC: 4.4 MIL/uL (ref 3.87–5.11)
RDW: 13.1 % (ref 11.5–15.5)
WBC: 9 10*3/uL (ref 4.0–10.5)
nRBC: 0 % (ref 0.0–0.2)

## 2023-08-21 LAB — LIPASE, BLOOD: Lipase: 33 U/L (ref 11–51)

## 2023-08-21 MED ORDER — ONDANSETRON 4 MG PO TBDP
4.0000 mg | ORAL_TABLET | Freq: Once | ORAL | Status: AC | PRN
  Administered 2023-08-21: 4 mg via ORAL
  Filled 2023-08-21: qty 1

## 2023-08-21 NOTE — Transitions of Care (Post Inpatient/ED Visit) (Signed)
   08/21/2023  Name: Priscilla Adams MRN: [id number] DOB: 1950-07-26  Today's TOC FU Call Status:    Attempted to reach the patient regarding the most recent Inpatient/ED visit.  Follow Up Plan: Additional outreach attempts will be made to reach the patient to complete the Transitions of Care (Post Inpatient/ED visit) call.   Rubyann Lingle J. Meshilem Machuca RN, MSN [facility], [facility] RN Care Manager Direct Dial: [phone]  Fax: [phone] Website: [email or web address]

## 2023-08-21 NOTE — ED Triage Notes (Signed)
 Pt has had vomiting and diarrhea that started 2 days ago. Pt had back surgery a week ago. Pt c/o pain in head and abdomen. Pt states, I don't feel right.

## 2023-08-21 NOTE — ED Provider Triage Note (Signed)
 Emergency Medicine Provider Triage Evaluation Note  Priscilla Adams , a 73 y.o. female  was evaluated in triage.  Pt complains of weakness, loose stool in the context of recent lumbar laminectomy.  Review of Systems  Positive: Diarrhea Negative: Syncope  Physical Exam  BP 134/83 (BP Location: Left Arm)   Pulse 77   Temp 98.6 F (37 C)   Resp 17   Ht 5' 8 (1.727 m)   Wt 86.3 kg   SpO2 97%   BMI 28.93 kg/m  Gen:   Awake, no distress elderly female sitting upright Resp:  Normal effort no increased work of breathing MSK:   Moves extremities without difficulty no obvious deformity Medical Decision Making  Medically screening exam initiated at 12:23 PM.  Appropriate orders placed.  Priscilla Adams was informed that the remainder of the evaluation will be completed by another provider, this initial triage assessment does not replace that evaluation, and the importance of remaining in the ED until their evaluation is complete.   Dorenda Gandy, MD 08/21/23 1224

## 2023-08-21 NOTE — ED Notes (Signed)
 Patient seen leaving ED with visitor pushing her in a wheelchair.  Patient encouraged to stay to complete treatment and evaluation and patient said I've been here since 1000, I'm not supposed to sit this long, I'm leaving.

## 2023-08-22 ENCOUNTER — Telehealth: Payer: Self-pay

## 2023-08-22 NOTE — Transitions of Care (Post Inpatient/ED Visit) (Signed)
   08/22/2023  Name: Priscilla Adams MRN: [id number] DOB: 1951-01-18  Today's TOC FU Call Status: Today's TOC FU Call Status:: Unsuccessful Call (3rd Attempt) Unsuccessful Call (3rd Attempt) Date: 08/22/23  Attempted to reach the patient regarding the most recent Inpatient/ED visit.  Follow Up Plan: No further outreach attempts will be made at this time. We have been unable to contact the patient.  Nivin Braniff J. Siarra Gilkerson RN, MSN [facility], [facility] RN Care Manager Direct Dial: [phone]  Fax: [phone] Website: [email or web address]

## 2023-08-23 DIAGNOSIS — M5431 Sciatica, right side: Secondary | ICD-10-CM | POA: Diagnosis not present

## 2023-08-23 DIAGNOSIS — Z6831 Body mass index (BMI) 31.0-31.9, adult: Secondary | ICD-10-CM | POA: Diagnosis not present

## 2023-08-29 ENCOUNTER — Encounter (INDEPENDENT_AMBULATORY_CARE_PROVIDER_SITE_OTHER): Admitting: Ophthalmology

## 2023-09-05 ENCOUNTER — Ambulatory Visit: Admitting: Physical Therapy

## 2023-09-19 ENCOUNTER — Encounter (INDEPENDENT_AMBULATORY_CARE_PROVIDER_SITE_OTHER): Payer: Self-pay

## 2023-09-19 ENCOUNTER — Encounter (INDEPENDENT_AMBULATORY_CARE_PROVIDER_SITE_OTHER): Admitting: Ophthalmology

## 2023-09-24 DIAGNOSIS — M5431 Sciatica, right side: Secondary | ICD-10-CM | POA: Diagnosis not present

## 2023-09-24 DIAGNOSIS — E1122 Type 2 diabetes mellitus with diabetic chronic kidney disease: Secondary | ICD-10-CM | POA: Diagnosis not present

## 2023-09-24 DIAGNOSIS — E7849 Other hyperlipidemia: Secondary | ICD-10-CM | POA: Diagnosis not present

## 2023-09-24 DIAGNOSIS — I10 Essential (primary) hypertension: Secondary | ICD-10-CM | POA: Diagnosis not present

## 2023-09-24 DIAGNOSIS — N182 Chronic kidney disease, stage 2 (mild): Secondary | ICD-10-CM | POA: Diagnosis not present

## 2023-09-24 DIAGNOSIS — Z6830 Body mass index (BMI) 30.0-30.9, adult: Secondary | ICD-10-CM | POA: Diagnosis not present

## 2023-09-25 ENCOUNTER — Encounter (INDEPENDENT_AMBULATORY_CARE_PROVIDER_SITE_OTHER): Admitting: Ophthalmology

## 2023-09-25 DIAGNOSIS — H43813 Vitreous degeneration, bilateral: Secondary | ICD-10-CM

## 2023-09-25 DIAGNOSIS — H353231 Exudative age-related macular degeneration, bilateral, with active choroidal neovascularization: Secondary | ICD-10-CM | POA: Diagnosis not present

## 2023-09-25 DIAGNOSIS — I10 Essential (primary) hypertension: Secondary | ICD-10-CM | POA: Diagnosis not present

## 2023-09-25 DIAGNOSIS — H35033 Hypertensive retinopathy, bilateral: Secondary | ICD-10-CM

## 2023-11-13 DIAGNOSIS — E1144 Type 2 diabetes mellitus with diabetic amyotrophy: Secondary | ICD-10-CM | POA: Diagnosis not present

## 2023-11-20 ENCOUNTER — Encounter (INDEPENDENT_AMBULATORY_CARE_PROVIDER_SITE_OTHER): Admitting: Ophthalmology

## 2023-11-20 DIAGNOSIS — H353231 Exudative age-related macular degeneration, bilateral, with active choroidal neovascularization: Secondary | ICD-10-CM

## 2023-11-20 DIAGNOSIS — H35033 Hypertensive retinopathy, bilateral: Secondary | ICD-10-CM

## 2023-11-20 DIAGNOSIS — H43813 Vitreous degeneration, bilateral: Secondary | ICD-10-CM

## 2023-11-20 DIAGNOSIS — I10 Essential (primary) hypertension: Secondary | ICD-10-CM | POA: Diagnosis not present

## 2023-12-07 DIAGNOSIS — Z6830 Body mass index (BMI) 30.0-30.9, adult: Secondary | ICD-10-CM | POA: Diagnosis not present

## 2023-12-07 DIAGNOSIS — M5126 Other intervertebral disc displacement, lumbar region: Secondary | ICD-10-CM | POA: Diagnosis not present

## 2023-12-31 DIAGNOSIS — M5431 Sciatica, right side: Secondary | ICD-10-CM | POA: Diagnosis not present

## 2023-12-31 DIAGNOSIS — G43909 Migraine, unspecified, not intractable, without status migrainosus: Secondary | ICD-10-CM | POA: Diagnosis not present

## 2023-12-31 DIAGNOSIS — Z6830 Body mass index (BMI) 30.0-30.9, adult: Secondary | ICD-10-CM | POA: Diagnosis not present

## 2023-12-31 DIAGNOSIS — I10 Essential (primary) hypertension: Secondary | ICD-10-CM | POA: Diagnosis not present

## 2023-12-31 DIAGNOSIS — E7849 Other hyperlipidemia: Secondary | ICD-10-CM | POA: Diagnosis not present

## 2023-12-31 DIAGNOSIS — N182 Chronic kidney disease, stage 2 (mild): Secondary | ICD-10-CM | POA: Diagnosis not present

## 2023-12-31 DIAGNOSIS — N3941 Urge incontinence: Secondary | ICD-10-CM | POA: Diagnosis not present

## 2023-12-31 DIAGNOSIS — E1122 Type 2 diabetes mellitus with diabetic chronic kidney disease: Secondary | ICD-10-CM | POA: Diagnosis not present

## 2024-01-08 ENCOUNTER — Encounter (INDEPENDENT_AMBULATORY_CARE_PROVIDER_SITE_OTHER): Admitting: Ophthalmology

## 2024-01-22 ENCOUNTER — Encounter (INDEPENDENT_AMBULATORY_CARE_PROVIDER_SITE_OTHER): Admitting: Ophthalmology

## 2024-01-22 DIAGNOSIS — H353231 Exudative age-related macular degeneration, bilateral, with active choroidal neovascularization: Secondary | ICD-10-CM

## 2024-01-22 DIAGNOSIS — H35033 Hypertensive retinopathy, bilateral: Secondary | ICD-10-CM

## 2024-01-22 DIAGNOSIS — I10 Essential (primary) hypertension: Secondary | ICD-10-CM | POA: Diagnosis not present

## 2024-01-22 DIAGNOSIS — H43813 Vitreous degeneration, bilateral: Secondary | ICD-10-CM

## 2024-03-19 ENCOUNTER — Encounter (INDEPENDENT_AMBULATORY_CARE_PROVIDER_SITE_OTHER): Admitting: Ophthalmology

## 2024-03-19 DIAGNOSIS — H35033 Hypertensive retinopathy, bilateral: Secondary | ICD-10-CM | POA: Diagnosis not present

## 2024-03-19 DIAGNOSIS — H353231 Exudative age-related macular degeneration, bilateral, with active choroidal neovascularization: Secondary | ICD-10-CM

## 2024-03-19 DIAGNOSIS — H43813 Vitreous degeneration, bilateral: Secondary | ICD-10-CM

## 2024-03-19 DIAGNOSIS — I10 Essential (primary) hypertension: Secondary | ICD-10-CM | POA: Diagnosis not present

## 2024-05-14 ENCOUNTER — Encounter (INDEPENDENT_AMBULATORY_CARE_PROVIDER_SITE_OTHER): Admitting: Ophthalmology
